# Patient Record
Sex: Male | Born: 1975
Health system: Southern US, Community
[De-identification: ages and names within clinical notes are randomized; demographics above are authoritative.]

## PROBLEM LIST (undated history)

## (undated) DIAGNOSIS — T7840XA Allergy, unspecified, initial encounter: Secondary | ICD-10-CM

## (undated) DIAGNOSIS — F419 Anxiety disorder, unspecified: Secondary | ICD-10-CM

## (undated) DIAGNOSIS — K219 Gastro-esophageal reflux disease without esophagitis: Secondary | ICD-10-CM

## (undated) DIAGNOSIS — I10 Essential (primary) hypertension: Secondary | ICD-10-CM

## (undated) DIAGNOSIS — L309 Dermatitis, unspecified: Secondary | ICD-10-CM

## (undated) HISTORY — DX: Allergy, unspecified, initial encounter: T78.40XA

## (undated) HISTORY — DX: Gastro-esophageal reflux disease without esophagitis: K21.9

## (undated) HISTORY — DX: Dermatitis, unspecified: L30.9

## (undated) HISTORY — DX: Anxiety disorder, unspecified: F41.9

## (undated) HISTORY — PX: WISDOM TOOTH EXTRACTION: SHX21

---
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
## 2003-12-05 ENCOUNTER — Emergency Department (HOSPITAL_COMMUNITY): Admission: AD | Admit: 2003-12-05 | Discharge: 2003-12-05 | Payer: Self-pay | Admitting: Family Medicine

## 2004-01-05 ENCOUNTER — Emergency Department (HOSPITAL_COMMUNITY): Admission: EM | Admit: 2004-01-05 | Discharge: 2004-01-05 | Payer: Self-pay | Admitting: Emergency Medicine

## 2009-05-12 ENCOUNTER — Emergency Department (HOSPITAL_COMMUNITY): Admission: EM | Admit: 2009-05-12 | Discharge: 2009-05-12 | Payer: Self-pay | Admitting: Emergency Medicine

## 2010-03-04 ENCOUNTER — Emergency Department (HOSPITAL_COMMUNITY): Admission: EM | Admit: 2010-03-04 | Discharge: 2010-03-04 | Payer: Self-pay | Admitting: Family Medicine

## 2011-03-13 LAB — CBC
MCHC: 34.4 g/dL (ref 30.0–36.0)
Platelets: 227 10*3/uL (ref 150–400)
RBC: 5.13 MIL/uL (ref 4.22–5.81)
WBC: 11.1 10*3/uL — ABNORMAL HIGH (ref 4.0–10.5)

## 2011-03-13 LAB — BASIC METABOLIC PANEL
CO2: 25 mEq/L (ref 19–32)
Calcium: 8.7 mg/dL (ref 8.4–10.5)
Creatinine, Ser: 0.97 mg/dL (ref 0.4–1.5)
GFR calc Af Amer: >60 mL/min (ref >60)

## 2011-03-13 LAB — DIFFERENTIAL
Basophils Relative: 0 % (ref 0–1)
Monocytes Relative: 8 % (ref 3–12)
Neutro Abs: 8 10*3/uL — ABNORMAL HIGH (ref 1.7–7.7)
Neutrophils Relative %: 72 % (ref 43–77)

## 2011-08-09 ENCOUNTER — Encounter: Payer: Self-pay | Admitting: Family Medicine

## 2011-08-09 ENCOUNTER — Ambulatory Visit (INDEPENDENT_AMBULATORY_CARE_PROVIDER_SITE_OTHER): Payer: 59 | Admitting: Family Medicine

## 2011-08-09 VITALS — BP 129/88 | HR 87 | Temp 98.8°F | Ht 71.0 in | Wt 226.8 lb

## 2011-08-09 DIAGNOSIS — L259 Unspecified contact dermatitis, unspecified cause: Secondary | ICD-10-CM

## 2011-08-09 DIAGNOSIS — L309 Dermatitis, unspecified: Secondary | ICD-10-CM | POA: Insufficient documentation

## 2011-08-09 DIAGNOSIS — F419 Anxiety disorder, unspecified: Secondary | ICD-10-CM | POA: Insufficient documentation

## 2011-08-09 DIAGNOSIS — K219 Gastro-esophageal reflux disease without esophagitis: Secondary | ICD-10-CM | POA: Insufficient documentation

## 2011-08-09 DIAGNOSIS — R030 Elevated blood-pressure reading, without diagnosis of hypertension: Secondary | ICD-10-CM

## 2011-08-09 DIAGNOSIS — I10 Essential (primary) hypertension: Secondary | ICD-10-CM | POA: Insufficient documentation

## 2011-08-09 DIAGNOSIS — F411 Generalized anxiety disorder: Secondary | ICD-10-CM

## 2011-08-09 NOTE — Patient Instructions (Signed)
It was nice meeting you and your daughter Let me know when you need new scripts. The only major health issue to address is your weight. See me in 6 months.

## 2011-08-09 NOTE — Progress Notes (Signed)
  Subjective:    Patient ID: Eric Bradford., male    DOB: 24-Apr-1976, 35 y.o.   MRN: [id number]  HPI Uses only 6-7 xanax per month average for situational anxiety. Gets annual flu shots through employee health.  Thinks he got tetanus about 2004 - maybe Tdap - he will check. Had cholesterol done ~ 1year ago with benefit package.  Was told great LDL, but HDL was a bit low.    Review of Systems     Objective:   Physical Exam  HEENT normal Neck supple without masses Lungs clear Cardiac RRR      Assessment & Plan:

## 2011-08-10 NOTE — Assessment & Plan Note (Signed)
Stable on meds  

## 2011-08-10 NOTE — Assessment & Plan Note (Signed)
Continue low salt diet and wt loss.  May be able to stop carvedolol at some point.

## 2011-11-05 ENCOUNTER — Observation Stay (HOSPITAL_COMMUNITY)
Admission: EM | Admit: 2011-11-05 | Discharge: 2011-11-05 | Disposition: A | Discharge status: Home / Self Care | Payer: 59 | Attending: Emergency Medicine | Admitting: Emergency Medicine

## 2011-11-05 ENCOUNTER — Encounter (HOSPITAL_COMMUNITY): Payer: Self-pay | Admitting: Nurse Practitioner

## 2011-11-05 ENCOUNTER — Emergency Department (HOSPITAL_COMMUNITY)
Admission: EM | Admit: 2011-11-05 | Discharge: 2011-11-05 | Disposition: A | Discharge status: Nursing Home (Includes ICF) | Payer: 59 | Source: Home / Self Care

## 2011-11-05 ENCOUNTER — Encounter (HOSPITAL_COMMUNITY): Payer: Self-pay | Admitting: *Deleted

## 2011-11-05 DIAGNOSIS — R112 Nausea with vomiting, unspecified: Principal | ICD-10-CM | POA: Insufficient documentation

## 2011-11-05 DIAGNOSIS — R197 Diarrhea, unspecified: Secondary | ICD-10-CM | POA: Insufficient documentation

## 2011-11-05 DIAGNOSIS — E860 Dehydration: Secondary | ICD-10-CM

## 2011-11-05 DIAGNOSIS — K529 Noninfective gastroenteritis and colitis, unspecified: Secondary | ICD-10-CM

## 2011-11-05 DIAGNOSIS — K5289 Other specified noninfective gastroenteritis and colitis: Secondary | ICD-10-CM

## 2011-11-05 HISTORY — DX: Essential (primary) hypertension: I10

## 2011-11-05 LAB — COMPREHENSIVE METABOLIC PANEL
ALT: 25 U/L (ref 0–53)
AST: 13 U/L (ref 0–37)
Alkaline Phosphatase: 81 U/L (ref 39–117)
CO2: 22 mEq/L (ref 19–32)
GFR calc Af Amer: >90 mL/min (ref >90)
GFR calc non Af Amer: >90 mL/min (ref >90)
Glucose, Bld: 118 mg/dL — ABNORMAL HIGH (ref 70–99)
Potassium: 3.7 mEq/L (ref 3.5–5.1)
Sodium: 140 mEq/L (ref 135–145)

## 2011-11-05 LAB — URINALYSIS, ROUTINE W REFLEX MICROSCOPIC
Leukocytes, UA: NEGATIVE
Nitrite: NEGATIVE
Specific Gravity, Urine: 1.024 (ref 1.005–1.030)
Urobilinogen, UA: 0.2 mg/dL (ref 0.0–1.0)

## 2011-11-05 LAB — CBC
Hemoglobin: 14.8 g/dL (ref 13.0–17.0)
RBC: 5.33 MIL/uL (ref 4.22–5.81)
WBC: 17.3 10*3/uL — ABNORMAL HIGH (ref 4.0–10.5)

## 2011-11-05 LAB — URINE MICROSCOPIC-ADD ON

## 2011-11-05 MED ORDER — LORAZEPAM 2 MG/ML IJ SOLN
1.0000 mg | Freq: Once | INTRAMUSCULAR | Status: AC
  Administered 2011-11-05: 1 mg via INTRAVENOUS
  Filled 2011-11-05: qty 1

## 2011-11-05 MED ORDER — ONDANSETRON HCL 4 MG/2ML IJ SOLN: 4.0000 mg | Freq: Four times a day (QID) | INTRAMUSCULAR | Status: DC | PRN

## 2011-11-05 MED ORDER — SODIUM CHLORIDE 0.9 % IV SOLN
INTRAVENOUS | Status: DC
  Administered 2011-11-05: 19:00:00 via INTRAVENOUS

## 2011-11-05 MED ORDER — ONDANSETRON HCL 4 MG/2ML IJ SOLN
4.0000 mg | Freq: Once | INTRAMUSCULAR | Status: AC
  Administered 2011-11-05: 4 mg via INTRAVENOUS
  Filled 2011-11-05: qty 2

## 2011-11-05 MED ORDER — ACETAMINOPHEN 325 MG PO TABS
650.0000 mg | ORAL_TABLET | Freq: Once | ORAL | Status: AC
  Administered 2011-11-05: 650 mg via ORAL
  Filled 2011-11-05: qty 2

## 2011-11-05 MED ORDER — ACETAMINOPHEN 325 MG PO TABS: 650.0000 mg | ORAL_TABLET | ORAL | Status: DC | PRN

## 2011-11-05 MED ORDER — SODIUM CHLORIDE 0.9 % IV BOLUS (SEPSIS)
1000.0000 mL | Freq: Once | INTRAVENOUS | Status: AC
  Administered 2011-11-05: 1000 mL via INTRAVENOUS

## 2011-11-05 MED ORDER — SODIUM CHLORIDE 0.9 % IV BOLUS (SEPSIS): 1000.0000 mL | Freq: Once | INTRAVENOUS | Status: DC

## 2011-11-05 MED ORDER — ONDANSETRON 4 MG PO TBDP
8.0000 mg | ORAL_TABLET | Freq: Once | ORAL | Status: AC
  Administered 2011-11-05: 8 mg via ORAL

## 2011-11-05 MED ORDER — CARVEDILOL 12.5 MG PO TABS
12.5000 mg | ORAL_TABLET | Freq: Two times a day (BID) | ORAL | Status: AC
  Administered 2011-11-05: 12.5 mg via ORAL
  Filled 2011-11-05: qty 1

## 2011-11-05 MED ORDER — ONDANSETRON 4 MG PO TBDP
ORAL_TABLET | ORAL | Status: AC
  Filled 2011-11-05: qty 2

## 2011-11-05 MED ORDER — ONDANSETRON HCL 4 MG PO TABS: 4.0000 mg | ORAL_TABLET | Freq: Four times a day (QID) | ORAL | Status: AC | PRN

## 2011-11-05 NOTE — ED Provider Notes (Signed)
I saw pt primarily and then tranferred to the CDU for further care by the PA.  Gwyneth Sprout, MD 11/05/11 2303

## 2011-11-05 NOTE — ED Provider Notes (Signed)
05:30 PM The patient is in the CDU and dehydration protocol. He has had nausea, vomiting, and diarrhea that began today with multiple family members having a similar illness. He has been febrile and tachycardic in the emergency department. He did not take his morning dose of Coreg today. The plan is to continue IV fluids, monitoring, and administer his Coreg with the hope that his heart rate will settle into a normal range and he can be discharged home as he is tolerating PO well.  06:00 PM  Date: 11/05/2011  Rate: 124  Rhythm: sinus tachycardia  QRS Axis: left  Intervals: normal  ST/T Wave abnormalities: normal  Conduction Disutrbances:none  Narrative Interpretation:   Old EKG Reviewed: none available   8:28 PM Patient reports he feels ready for discharge home. He has not had any continued vomiting in the CDU. His heart rate on a telemetry monitor in his room is hovering around 105 beats per minute. I will discharge him home with instructions to continue oral rehydration; he will be given a prescription for Zofran. I have advised him to return to the emergency department for reevaluation if he is unable to keep down fluids or if he feels that his heart rate has started to rise again.   Elwyn Reach [place], [place] 11/05/11 2030

## 2011-11-05 NOTE — ED Notes (Signed)
Pt reminded of need for urine sample, unable to void at this time

## 2011-11-05 NOTE — ED Notes (Signed)
Pt repeatedly requesting PO fluids.  Oral rehydration solution provided - instructed to take small, frequent sips.

## 2011-11-05 NOTE — ED Notes (Signed)
C/O n/v/d since middle of the night.  C/O abd soreness from vomiting, but otherwise no pain.  Daughter has similar sxs.  Has had approx 5-6 times; watery stools without blood.  C/O dizziness with standing.

## 2011-11-05 NOTE — ED Notes (Signed)
Pt. Ate 20 % of his dinner.

## 2011-11-05 NOTE — ED Provider Notes (Signed)
History     CSN: [id number] Arrival date & time: 11/05/2011 10:27 AM   None     Chief Complaint  Patient presents with  . GI Problem    Pt states he has diarrhea, vomiting and diarrhea since midnight    (Consider location/radiation/quality/duration/timing/severity/associated sxs/prior treatment) HPI Comments: Onset last night of watery diarrhea, nausea and vomiting. Has been able to retain only small amounts of water. No abd pain or fever. He states his wife and daughter are ill with same symptoms.   Patient is a 35 y.o. male presenting with GI illlness. The history is provided by the patient.  GI Problem  This is a new problem. The current episode started 12 to 24 hours ago. The problem occurs 5 to 10 times per day. The problem has not changed since onset.The stool consistency is described as watery. There has been no fever. Associated symptoms include vomiting. Pertinent negatives include no abdominal pain, no chills, no URI and no cough. He has tried nothing for the symptoms.    Past Medical History  Diagnosis Date  . Anxiety   . Allergy   . Eczema   . GERD (gastroesophageal reflux disease)   . Hypertension     Past Surgical History  Procedure Date  . Eye muscle surgery 12/04/1976    Family History  Problem Relation Age of Onset  . Cancer Mother   . Hypertension Father   . Alcohol abuse Maternal Uncle   . Stroke Paternal Grandfather   . Diabetes Paternal Grandfather     History  Substance Use Topics  . Smoking status: Never Smoker   . Smokeless tobacco: Never Used  . Alcohol Use: No      Review of Systems  Constitutional: Negative for chills.  Respiratory: Negative for cough and shortness of breath.   Gastrointestinal: Positive for nausea, vomiting and diarrhea. Negative for abdominal pain.  Genitourinary: Positive for decreased urine volume.    Allergies  Review of patient's allergies indicates no known allergies.  Home Medications   Current  Outpatient Rx  Name Route Sig Dispense Refill  . ALPRAZOLAM 1 MG PO TABS Oral Take 1 mg by mouth at bedtime as needed.      Marland Kitchen CARVEDILOL 12.5 MG PO TABS Oral Take 12.5 mg by mouth 2 (two) times daily with a meal.      . CLOBETASOL PROPIONATE 0.05 % EX CREA Topical Apply topically daily as needed. For hands     . FLUTICASONE PROPIONATE 50 MCG/ACT NA SUSP Nasal Place 2 sprays into the nose daily.      . IBUPROFEN 200 MG PO TABS Oral Take 200 mg by mouth every 6 (six) hours as needed.      Marland Kitchen RANITIDINE HCL 150 MG PO TABS Oral Take 150 mg by mouth 2 (two) times daily.        BP 116/66  Pulse 150  Temp(Src) 99.1 F (37.3 C) (Oral)  Resp 23  SpO2 99%  Physical Exam  Nursing note and vitals reviewed. Constitutional: He is oriented to person, place, and time. He appears well-developed and well-nourished.       NAD. Ill appearing.  HENT:  Head: Normocephalic and atraumatic.  Right Ear: Tympanic membrane, external ear and ear canal normal.  Left Ear: Tympanic membrane, external ear and ear canal normal.  Nose: Nose normal.  Mouth/Throat: Uvula is midline. Mucous membranes are dry. No oropharyngeal exudate, posterior oropharyngeal edema or posterior oropharyngeal erythema.  Neck: Neck supple.  Cardiovascular: Regular  rhythm and normal heart sounds.  Tachycardia present.   Pulmonary/Chest: Effort normal and breath sounds normal. No respiratory distress.  Abdominal: Soft. Bowel sounds are normal. He exhibits no distension and no mass. There is no tenderness. There is no guarding.  Lymphadenopathy:    He has no cervical adenopathy.  Neurological: He is alert and oriented to person, place, and time.  Skin: Skin is warm and dry.  Psychiatric: He has a normal mood and affect.    ED Course  Procedures (including critical care time)  Labs Reviewed - No data to display No results found.   1. Mild dehydration   2. Gastroenteritis       MDM  Pt transferred to [facility] via shuttle for IV  rehydration.         Melody Comas, [place] 11/05/11 1057

## 2011-11-05 NOTE — ED Notes (Signed)
Pt here from [facility] for dehydration, n/v/d since last night. Pt was given zofran 8 mg ODT at [facility]

## 2011-11-05 NOTE — ED Provider Notes (Addendum)
History     CSN: [id number] Arrival date & time: 11/05/2011 11:32 AM   First MD Initiated Contact with Patient 11/05/11 1136      Chief Complaint  Patient presents with  . Nausea    (Consider location/radiation/quality/duration/timing/severity/associated sxs/prior treatment) HPI Comments: Patient was seen at urgent care and given an ODT Zofran with persistent nausea and was sent here for hydration.  Patient is a 35 y.o. male presenting with vomiting and diarrhea. The history is provided by the patient.  Emesis  This is a new problem. The current episode started 3 to 5 hours ago. The problem occurs 5 to 10 times per day. The problem has not changed since onset.The emesis has an appearance of stomach contents. There has been no fever. Associated symptoms include chills and diarrhea. Pertinent negatives include no abdominal pain, no cough, no fever, no myalgias and no URI. Risk factors include ill contacts.  Diarrhea The primary symptoms include vomiting and diarrhea. Primary symptoms do not include fever, abdominal pain, dysuria or myalgias. The illness began today. The onset was sudden. The problem has not changed since onset. The illness is also significant for chills. The illness does not include back pain. Associated medical issues comments: None.    Past Medical History  Diagnosis Date  . Anxiety   . Allergy   . Eczema   . GERD (gastroesophageal reflux disease)   . Hypertension     Past Surgical History  Procedure Date  . Eye muscle surgery 12/04/1976    Family History  Problem Relation Age of Onset  . Cancer Mother   . Hypertension Father   . Alcohol abuse Maternal Uncle   . Stroke Paternal Grandfather   . Diabetes Paternal Grandfather     History  Substance Use Topics  . Smoking status: Never Smoker   . Smokeless tobacco: Never Used  . Alcohol Use: No      Review of Systems  Constitutional: Positive for chills. Negative for fever.  Respiratory: Negative  for cough.   Gastrointestinal: Positive for vomiting and diarrhea. Negative for abdominal pain and blood in stool.  Genitourinary: Negative for dysuria.  Musculoskeletal: Negative for myalgias and back pain.  All other systems reviewed and are negative.    Allergies  Review of patient's allergies indicates no known allergies.  Home Medications   Current Outpatient Rx  Name Route Sig Dispense Refill  . ALPRAZOLAM 1 MG PO TABS Oral Take 1 mg by mouth daily as needed. For anxiety.    Marland Kitchen CARVEDILOL 12.5 MG PO TABS Oral Take 12.5 mg by mouth 2 (two) times daily with a meal.      . CLOBETASOL PROPIONATE 0.05 % EX CREA Topical Apply topically daily as needed. For hands     . FLUTICASONE PROPIONATE 50 MCG/ACT NA SUSP Nasal Place 2 sprays into the nose daily.      . IBUPROFEN 200 MG PO TABS Oral Take 600 mg by mouth every 6 (six) hours as needed. For pain.    Marland Kitchen RANITIDINE HCL 150 MG PO TABS Oral Take 150 mg by mouth 2 (two) times daily.        BP 133/85  Pulse 129  Temp(Src) 98.6 F (37 C) (Oral)  Resp 16  SpO2 100%  Physical Exam  Nursing note and vitals reviewed. Constitutional: He is oriented to person, place, and time. He appears well-developed and well-nourished. No distress.  HENT:  Head: Normocephalic and atraumatic.  Mouth/Throat: Oropharynx is clear and moist. Mucous membranes  are dry.  Eyes: Conjunctivae and EOM are normal. Pupils are equal, round, and reactive to light.  Neck: Normal range of motion. Neck supple.  Cardiovascular: Regular rhythm and intact distal pulses.  Tachycardia present.   No murmur heard. Pulmonary/Chest: Effort normal and breath sounds normal. No respiratory distress. He has no wheezes. He has no rales.  Abdominal: Soft. He exhibits no distension. There is no tenderness. There is no rebound and no guarding.  Musculoskeletal: Normal range of motion. He exhibits no edema and no tenderness.  Neurological: He is alert and oriented to person, place, and  time.  Skin: Skin is warm and dry. No rash noted. No erythema.  Psychiatric: He has a normal mood and affect. His behavior is normal.    ED Course  Procedures (including critical care time)  Labs Reviewed  URINALYSIS, ROUTINE W REFLEX MICROSCOPIC - Abnormal; Notable for the following:    Hgb urine dipstick SMALL (*)    Ketones, ur 15 (*)    All other components within normal limits  URINE MICROSCOPIC-ADD ON   No results found.   No diagnosis found.    MDM   Pt with symptoms most consistent with a viral process with vomitting/diarrhea.  Denies bad food exposure and recent travel out of the country.  No recent abx.  No hx concerning for GU pathology or kidney stones.  Pt is awake and alert on exam without peritoneal signs.  Patient is tachycardic here.  Sent from urgent care due to persistent nausea after ODT Zofran.  Will concern for any surgical GI pathology.   Will give IV fluids and IV Zofran and recheck.   will by mouth challenge patient when he feels he can drink  3:12 PM After 2 L of IV fluid and Zofran, patient is feeling better and tolerating by mouth's. However persistent tachycardia and found to have a fever of 102.5. Patient given Tylenol. Also he is supposed to be taking carvedilol twice a day and did not have his morning dose which also may reflect his tachycardia. Home meds ordered. And will recheck. Patient states that he thought his urine looks dark however a UA is negative for infection.   4:52 PM  Patient with persistent tachycardia and fever. Still waiting for coreg. Will move over to CDU.    Gwyneth Sprout, MD 11/05/11 1652  Patient placed in the dehydration protocol.  Gwyneth Sprout, MD 11/05/11 [phone]

## 2011-11-05 NOTE — ED Notes (Signed)
Pt NAD, resp e/u, AOx4, ambulatory with steady gait at time of discharge. Pt states understanding of discharge instructions and denies questions at time of discharge.

## 2011-11-05 NOTE — ED Notes (Signed)
Pt brought straight back to room 2 when he arrived, there were 32 pts on the board and orthostatics done, MD notified, Pt states, "I need something done, I am a [employer] employee and I need zofran."  My daughter is in the waiting room also and she needs to come back.  I explained to him that there are 32 pts in the waiting room and there will be a wait.

## 2011-11-05 NOTE — ED Notes (Signed)
Transferred to ED via wheelchair w/ T. Berton Lan, EMT.  Wife updated on plan of care - verbalized understanding.

## 2011-11-10 NOTE — ED Provider Notes (Signed)
Medical screening examination/treatment/procedure(s) were performed by non-physician practitioner and as supervising physician I was immediately available for consultation/collaboration.  Luiz Blare MD   Luiz Blare, MD 11/10/11 [phone]

## 2012-04-26 ENCOUNTER — Other Ambulatory Visit: Payer: Self-pay | Admitting: Family Medicine

## 2012-04-26 DIAGNOSIS — R030 Elevated blood-pressure reading, without diagnosis of hypertension: Secondary | ICD-10-CM

## 2012-04-26 MED ORDER — CARVEDILOL 12.5 MG PO TABS: 12.5000 mg | ORAL_TABLET | Freq: Two times a day (BID) | ORAL | Status: DC

## 2012-04-26 NOTE — Assessment & Plan Note (Signed)
Refilled based on fax request 

## 2012-07-31 ENCOUNTER — Ambulatory Visit: Payer: 59 | Admitting: Family Medicine

## 2012-08-21 ENCOUNTER — Ambulatory Visit (INDEPENDENT_AMBULATORY_CARE_PROVIDER_SITE_OTHER): Payer: 59 | Admitting: Family Medicine

## 2012-08-21 ENCOUNTER — Encounter: Payer: Self-pay | Admitting: Family Medicine

## 2012-08-21 VITALS — BP 128/76 | HR 76 | Temp 98.7°F | Ht 71.0 in | Wt 229.0 lb

## 2012-08-21 DIAGNOSIS — K219 Gastro-esophageal reflux disease without esophagitis: Secondary | ICD-10-CM

## 2012-08-21 DIAGNOSIS — R030 Elevated blood-pressure reading, without diagnosis of hypertension: Secondary | ICD-10-CM

## 2012-08-21 DIAGNOSIS — F411 Generalized anxiety disorder: Secondary | ICD-10-CM

## 2012-08-21 DIAGNOSIS — F419 Anxiety disorder, unspecified: Secondary | ICD-10-CM

## 2012-08-21 DIAGNOSIS — L309 Dermatitis, unspecified: Secondary | ICD-10-CM

## 2012-08-21 DIAGNOSIS — L259 Unspecified contact dermatitis, unspecified cause: Secondary | ICD-10-CM

## 2012-08-21 MED ORDER — CLOBETASOL PROPIONATE 0.05 % EX CREA: TOPICAL_CREAM | Freq: Every day | CUTANEOUS | Status: DC | PRN

## 2012-08-21 MED ORDER — ALPRAZOLAM 1 MG PO TABS: 1.0000 mg | ORAL_TABLET | Freq: Every day | ORAL | Status: DC | PRN

## 2012-08-21 MED ORDER — FLUTICASONE PROPIONATE 50 MCG/ACT NA SUSP: 2.0000 | Freq: Every day | NASAL | Status: DC

## 2012-08-21 MED ORDER — CARVEDILOL 12.5 MG PO TABS: 12.5000 mg | ORAL_TABLET | Freq: Two times a day (BID) | ORAL | Status: DC

## 2012-08-21 MED ORDER — RANITIDINE HCL 150 MG PO TABS: 150.0000 mg | ORAL_TABLET | Freq: Two times a day (BID) | ORAL | Status: DC

## 2012-08-21 NOTE — Patient Instructions (Addendum)
Sign up for My Chart I refilled all your meds. Let me know when you get your flu shot. I refilled all your meds

## 2012-08-22 NOTE — Assessment & Plan Note (Signed)
Stable, refill meds

## 2012-08-22 NOTE — Assessment & Plan Note (Signed)
No change 

## 2012-08-22 NOTE — Progress Notes (Signed)
  Subjective:    Patient ID: Eric Dung., male    DOB: 17-Aug-1976, 36 y.o.   MRN: [id number]  HPI Doing well, no complaints.  Taking meds without problems.  Will get flu shot through employee health.   Review of Systems     Objective:   Physical ExamLungs clear Cardiac RRR without m or g        Assessment & Plan:

## 2013-01-18 ENCOUNTER — Other Ambulatory Visit: Payer: Self-pay

## 2013-05-29 ENCOUNTER — Other Ambulatory Visit: Payer: Self-pay | Admitting: Family Medicine

## 2013-05-29 ENCOUNTER — Other Ambulatory Visit: Payer: Self-pay | Admitting: *Deleted

## 2013-05-29 DIAGNOSIS — F419 Anxiety disorder, unspecified: Secondary | ICD-10-CM

## 2013-05-29 MED ORDER — ALPRAZOLAM 1 MG PO TABS: 1.0000 mg | ORAL_TABLET | Freq: Every day | ORAL | Status: DC | PRN

## 2013-05-29 NOTE — Telephone Encounter (Signed)
Refill called in. 

## 2013-05-30 ENCOUNTER — Ambulatory Visit (HOSPITAL_COMMUNITY)
Admission: RE | Admit: 2013-05-30 | Discharge: 2013-05-30 | Disposition: A | Discharge status: Home / Self Care | Payer: 59 | Source: Ambulatory Visit | Attending: Family Medicine | Admitting: Family Medicine

## 2013-05-30 ENCOUNTER — Ambulatory Visit (INDEPENDENT_AMBULATORY_CARE_PROVIDER_SITE_OTHER): Payer: 59 | Admitting: Family Medicine

## 2013-05-30 ENCOUNTER — Other Ambulatory Visit: Payer: Self-pay

## 2013-05-30 ENCOUNTER — Encounter: Payer: Self-pay | Admitting: Family Medicine

## 2013-05-30 VITALS — BP 135/94 | HR 97 | Temp 98.3°F | Resp 20 | Ht 71.0 in | Wt 239.0 lb

## 2013-05-30 DIAGNOSIS — K219 Gastro-esophageal reflux disease without esophagitis: Secondary | ICD-10-CM | POA: Insufficient documentation

## 2013-05-30 DIAGNOSIS — R079 Chest pain, unspecified: Secondary | ICD-10-CM

## 2013-05-30 DIAGNOSIS — R030 Elevated blood-pressure reading, without diagnosis of hypertension: Secondary | ICD-10-CM

## 2013-05-30 DIAGNOSIS — R059 Cough, unspecified: Secondary | ICD-10-CM | POA: Insufficient documentation

## 2013-05-30 DIAGNOSIS — R0789 Other chest pain: Secondary | ICD-10-CM | POA: Insufficient documentation

## 2013-05-30 DIAGNOSIS — R05 Cough: Secondary | ICD-10-CM | POA: Insufficient documentation

## 2013-05-30 MED ORDER — CARVEDILOL 12.5 MG PO TABS: 12.5000 mg | ORAL_TABLET | Freq: Two times a day (BID) | ORAL | Status: DC

## 2013-05-30 NOTE — Patient Instructions (Addendum)
It was great to see you again!  For your chest pain,  - I think this is stable angina. - I would like you to see a cardiologist for further evaluation, possibly an exercise stress test.  - Please continue taking medications as prescribed. Resume coreg as prescribed. - If you develop worsening chest pain, chest pain at rest, feel faint or sweaty during these episodes, you should seek immediate care either in clinic or the ED. - If you do not hear from our office on Monday or Tuesday, please call us to follow up on the cardiology appointment.  Follow-up with Dr. Leveda Anna after you see the cardiologist.

## 2013-05-30 NOTE — Progress Notes (Signed)
Pt reports chest pain that started while walking on treadmill for exercise - denies nausea, vomiting, diarrhea or radiating pain.  "I'm not going to the ER, I just want an EKG and to go home." Wyatt Haste, RN-BSN

## 2013-05-30 NOTE — Assessment & Plan Note (Addendum)
Pt is stable; likely stable angina (does not sound ischemic; possibly related to arrhythmia), though could also be anxiety or GERD-related. Unstable angina unlikely because no increasing sypmptoms or symptoms at rest. Unlikely PE because not pleuritic and no leg swelling, normal O2 sat. Unlikely MI because EKG WNL. Unlikely pneumothorax with normal breath sounds and no shortness of breath. Pain not severe or tearing and no known risk factors for dissection (not marfanoid). - Restart coreg at prescribed dose, hold for low blood pressures; reordered x 1 month to be sure pt has on hand - Cardiology referral ordered and would like pt to be seen next week for possible exercise stress test - Return precautions per AVS.  - Follow-up with Dr. Leveda Anna after you see the cardiologist.  Pt and plan discussed with preceptor Dr. Deirdre Priest.

## 2013-05-30 NOTE — Progress Notes (Signed)
Patient ID: Eric Dung., male   DOB: [birth year], 37 y.o.   MRN: [id number]  Subjective:    CC: Chest pain   HPI: Eric Narula. is a 37 y.o. male with anxiety, GERD, and prehypertension presenting with chest pain.   Chest pain - New issue. Pt states that 3 weeks ago he stopped taking his carvedilol due to not wanting to be on so many medications, by cutting down to 1/2 of prescribed dose x 1 week and then stopping. Around that time, he noticed chest crampy discomfort that was 4/10 at worst and came and went, coming on with physical activity and emotional stress and relieved with rest/calming down. It is somewhat related to position. He denies any shortness of breath, diaphoresis, dizziness/fainting, nausea, vomiting, fever, chills, cough, leg swelling, leg pain, muscle weakness or abdominal pain. He does not think this feels similar to GERD pain which is more epigastric-right arm and has mostly resolved with omeprazole. He also noticed that his heart would race, and was especially concerned when it raced even after stopping exercise and resting.  He has never had this type of chest pain and was on coreg for prehypertension and "adrenaline rushes"/anxiety. Yesterday morning he started back on 1/2 of his prescribed coreg. BP at home today 150s systolic and here is 135 systolic. He has also never seen a cardiologist.   Review of Systems - Per HPI.  PMH:  Anxiety GERD (Pre)HTN  SH:  Denies ever using tobacco. Very rarely alcohol use. X 1 year since last drink. No drugs.  FH:  - Grandfather with strokes in 78s - PreHTN runs in father's side of family including father.     Objective:  Physical Exam BP 135/94  Pulse 97  Temp(Src) 98.3 F (36.8 C) (Oral)  Resp 20  Ht 5\' 11"  (1.803 m)  Wt 239 lb (108.41 kg)  BMI 33.35 kg/m2  SpO2 97% GEN: NAD, pleasant CV: RRR, no m/r/g PULM: CTAB, normal effort, no wheezes or crackles ABD: Soft, obese, nontender, nondistended EXTR:  No tenderness, edema, erythema, or asymmetry CHEST: No tenderness to palpation NEURO: Alert, oriented, no focal deficit    Assessment:     Eric K Cameran Pettey. is a 37 y.o. male with anxiety, GERD, and prehypertension presenting with chest pain.     Plan:     # See problem list for problem-specific plans.

## 2013-06-09 NOTE — Progress Notes (Signed)
Patient ID: Eric Bradford., male   DOB: 07-Jul-1976, 37 y.o.   MRN: [id number]   Per RN message 6/30, called patient to discuss referral and at that time pt declined appt at this time stating "I'm doing better since starting back on my medication. I have been running up the stairs at [facility] with no problem." RN advised patient to give [place] a call back if he feels he would like for [place] to proceed with referral.  Leona Singleton, MD 06/09/2013 12:08 AM

## 2013-06-16 ENCOUNTER — Encounter: Payer: Self-pay | Admitting: Family Medicine

## 2013-06-16 ENCOUNTER — Ambulatory Visit (INDEPENDENT_AMBULATORY_CARE_PROVIDER_SITE_OTHER): Payer: 59 | Admitting: Family Medicine

## 2013-06-16 VITALS — BP 126/71 | HR 77 | Temp 98.9°F | Ht 71.0 in | Wt 242.0 lb

## 2013-06-16 DIAGNOSIS — J029 Acute pharyngitis, unspecified: Secondary | ICD-10-CM

## 2013-06-16 DIAGNOSIS — J039 Acute tonsillitis, unspecified: Secondary | ICD-10-CM

## 2013-06-16 MED ORDER — AMOXICILLIN 500 MG PO TABS: 500.0000 mg | ORAL_TABLET | Freq: Two times a day (BID) | ORAL | Status: DC

## 2013-06-16 NOTE — Assessment & Plan Note (Signed)
Given symptoms and physical exam, will treat with empiric amoxicillin x 7 days.

## 2013-06-16 NOTE — Patient Instructions (Addendum)
I have prescribed an antibiotic (Amoxicillin). Please take it twice a day for 7 days.    Tonsillitis Tonsils are lumps of lymphoid tissues at the back of the throat. Each tonsil has 20 crevices (crypts). Tonsils help fight nose and throat infections and keep infection from spreading to other parts of the body for the first 18 months of life. Tonsillitis is an infection of the throat that causes the tonsils to become red, tender, and swollen. CAUSES Sudden and, if treated, temporary (acute) tonsillitis is usually caused by infection with streptococcal bacteria. Long lasting (chronic) tonsillitis occurs when the crypts of the tonsils become filled with pieces of food and bacteria, which makes it easy for the tonsils to become constantly infected. SYMPTOMS  Symptoms of tonsillitis include:  A sore throat.  White patches on the tonsils.  Fever.  Tiredness. DIAGNOSIS Tonsillitis can be diagnosed through a physical exam. Diagnosis can be confirmed with the results of lab tests, including a throat culture. TREATMENT  The goals of tonsillitis treatment include the reduction of the severity and duration of symptoms, prevention of associated conditions, and prevention of disease transmission. Tonsillitis caused by bacteria can be treated with antibiotics. Usually, treatment with antibiotics is started before the cause of the tonsillitis is known. However, if it is determined that the cause is not bacterial, antibiotics will not treat the tonsillitis. If attacks of tonsillitis are severe and frequent, your caregiver may recommend surgery to remove the tonsils (tonsillectomy). HOME CARE INSTRUCTIONS   Rest as much as possible and get plenty of sleep.  Drink plenty of fluids. While the throat is very sore, eat soft foods or liquids, such as sherbet, soups, or instant breakfast drinks.  Eat frozen ice pops.  Older children and adults may gargle with a warm or cold liquid to help soothe the throat. Mix  1 teaspoon of salt in 1 cup of water.  Other family members who also develop a sore throat or fever should have a medical exam or throat culture.  Only take over-the-counter or prescription medicines for pain, discomfort, or fever as directed by your caregiver.  If you are given antibiotics, take them as directed. Finish them even if you start to feel better. SEEK MEDICAL CARE IF:   Your baby is older than 3 months with a rectal temperature of 100.5 F (38.1 C) or higher for more than 1 day.  Large, tender lumps develop in your neck.  A rash develops.  Green, yellow-brown, or bloody substance is coughed up.  You are unable to swallow liquids or food for 24 hours.  Your child is unable to swallow food or liquids for 12 hours. SEEK IMMEDIATE MEDICAL CARE IF:   You develop any new symptoms such as vomiting, severe headache, stiff neck, chest pain, or trouble breathing or swallowing.  You have severe throat pain along with drooling or voice changes.  You have severe pain, unrelieved with recommended medications.  You are unable to fully open the mouth.  You develop redness, swelling, or severe pain anywhere in the neck.  You have a fever.  Your baby is older than 3 months with a rectal temperature of 102 F (38.9 C) or higher.  Your baby is 60 months old or younger with a rectal temperature of 100.4 F (38 C) or higher. MAKE SURE YOU:   Understand these instructions.  Will watch your condition.  Will get help right away if you are not doing well or get worse. Document Released: 08/30/2005 Document  Revised: 02/12/2012 Document Reviewed: 01/26/2011 [facility] Patient Information 2014 [place], [place].

## 2013-06-16 NOTE — Progress Notes (Signed)
Subjective:     Patient ID: Eric Dung., male   DOB: Apr 14, 1976, 37 y.o.   MRN: [id number]  HPI 37 year old gentlemen presents to clinic for evaluation of sore throat.  1) Sore Throat - Patient reports sore throat x 5 days. - No associated fever, chills, cough, sputum production, or SOB.  - He does endorse hoarseness.  He also reports some "white spots" on the back of his throat. - OTC advil improves discomfort.  Review of Systems Per HPI    Objective:   Physical Exam Filed Vitals:   06/16/13 1535  BP: 126/71  Pulse: 77  Temp: 98.9 F (37.2 C)   General: well appearing. NAD. HEENT: Throat - pharyngeal/tonsillar erythema noted.  No tonsillar exudate noted. Neck: No cervical lymphadenopathy.     Assessment:     See Problem list    Plan:

## 2013-07-16 ENCOUNTER — Encounter: Payer: 59 | Admitting: Family Medicine

## 2013-08-19 ENCOUNTER — Telehealth: Payer: Self-pay | Admitting: Family Medicine

## 2013-08-19 DIAGNOSIS — R030 Elevated blood-pressure reading, without diagnosis of hypertension: Secondary | ICD-10-CM

## 2013-08-19 MED ORDER — CARVEDILOL 12.5 MG PO TABS: 12.5000 mg | ORAL_TABLET | Freq: Two times a day (BID) | ORAL | Status: DC

## 2013-08-19 NOTE — Telephone Encounter (Signed)
Patient calls needing an appt with Dr. Leveda Anna. Dr. Leveda Anna has no available appt until October. Patient will be out his carvedilol by this time and is asking for a refill on it. Patient has made an appt for 10/8 at 3:45

## 2013-08-19 NOTE — Telephone Encounter (Signed)
Please advise. Eric Bradford S  

## 2013-08-19 NOTE — Telephone Encounter (Signed)
Please notify patient that a refill was sent to [facility].

## 2013-08-20 NOTE — Telephone Encounter (Signed)
Related message,pt voiced understanding. Harith Mccadden S  

## 2013-09-10 ENCOUNTER — Ambulatory Visit: Payer: 59 | Admitting: Family Medicine

## 2013-09-17 ENCOUNTER — Encounter: Payer: Self-pay | Admitting: Family Medicine

## 2013-09-17 ENCOUNTER — Ambulatory Visit (INDEPENDENT_AMBULATORY_CARE_PROVIDER_SITE_OTHER): Payer: 59 | Admitting: Family Medicine

## 2013-09-17 VITALS — BP 134/87 | HR 85 | Temp 99.3°F | Ht 71.0 in | Wt 241.0 lb

## 2013-09-17 DIAGNOSIS — F411 Generalized anxiety disorder: Secondary | ICD-10-CM

## 2013-09-17 DIAGNOSIS — F419 Anxiety disorder, unspecified: Secondary | ICD-10-CM

## 2013-09-17 DIAGNOSIS — R030 Elevated blood-pressure reading, without diagnosis of hypertension: Secondary | ICD-10-CM

## 2013-09-17 DIAGNOSIS — L309 Dermatitis, unspecified: Secondary | ICD-10-CM

## 2013-09-17 DIAGNOSIS — K219 Gastro-esophageal reflux disease without esophagitis: Secondary | ICD-10-CM

## 2013-09-17 DIAGNOSIS — E785 Hyperlipidemia, unspecified: Secondary | ICD-10-CM

## 2013-09-17 DIAGNOSIS — E669 Obesity, unspecified: Secondary | ICD-10-CM

## 2013-09-17 DIAGNOSIS — Z0000 Encounter for general adult medical examination without abnormal findings: Secondary | ICD-10-CM

## 2013-09-17 DIAGNOSIS — L259 Unspecified contact dermatitis, unspecified cause: Secondary | ICD-10-CM

## 2013-09-17 LAB — COMPLETE METABOLIC PANEL WITH GFR
AST: 24 U/L (ref 0–37)
BUN: 14 mg/dL (ref 6–23)
Calcium: 9.4 mg/dL (ref 8.4–10.5)
Chloride: 104 mEq/L (ref 96–112)
Creat: 0.87 mg/dL (ref 0.50–1.35)
GFR, Est Non African American: >89 mL/min
Glucose, Bld: 88 mg/dL (ref 70–99)

## 2013-09-17 LAB — LIPID PANEL
Cholesterol: 131 mg/dL (ref 0–200)
HDL: 25 mg/dL — ABNORMAL LOW (ref >39)
Total CHOL/HDL Ratio: 5.2 Ratio
Triglycerides: 337 mg/dL — ABNORMAL HIGH (ref <150)

## 2013-09-17 LAB — CBC
MCV: 81.2 fL (ref 78.0–100.0)
Platelets: 218 10*3/uL (ref 150–400)
RBC: 5.27 MIL/uL (ref 4.22–5.81)
WBC: 10.1 10*3/uL (ref 4.0–10.5)

## 2013-09-17 MED ORDER — CLOBETASOL PROPIONATE 0.05 % EX CREA: TOPICAL_CREAM | Freq: Every day | CUTANEOUS | Status: DC | PRN

## 2013-09-17 MED ORDER — ALPRAZOLAM 1 MG PO TABS: 1.0000 mg | ORAL_TABLET | Freq: Every day | ORAL | Status: DC | PRN

## 2013-09-17 NOTE — Patient Instructions (Signed)
HDL is good cholesterol.  Given your history, you might want to google low HDL and see some dietary ways to increase. Keep working on your weight.  I am glad you will be getting more exercise. I will call if abnormal blood work.  Otherwise you can just view on my chart. Your last tetanus was 08/2006 and they are good for 10 years.

## 2013-09-18 ENCOUNTER — Encounter: Payer: Self-pay | Admitting: Family Medicine

## 2013-09-18 DIAGNOSIS — E669 Obesity, unspecified: Secondary | ICD-10-CM | POA: Insufficient documentation

## 2013-09-18 DIAGNOSIS — Z0000 Encounter for general adult medical examination without abnormal findings: Secondary | ICD-10-CM | POA: Insufficient documentation

## 2013-09-18 DIAGNOSIS — E7800 Pure hypercholesterolemia, unspecified: Secondary | ICD-10-CM | POA: Insufficient documentation

## 2013-09-18 NOTE — Assessment & Plan Note (Signed)
Controled, refill meds

## 2013-09-18 NOTE — Assessment & Plan Note (Signed)
Controled, refill meds 

## 2013-09-18 NOTE — Assessment & Plan Note (Signed)
Lifestyle intervention for now.

## 2013-09-18 NOTE — Assessment & Plan Note (Signed)
Wt loss  

## 2013-09-18 NOTE — Assessment & Plan Note (Signed)
Most problems seem wt related.  Strongly encouraged wt loss and increase exercise.  Also advised to read more about low HDL, high triglycerides and metabolic syndrom.

## 2013-09-18 NOTE — Progress Notes (Signed)
  Subjective:    Patient ID: Eric Bradford., male    DOB: 1976-01-20, 37 y.o.   MRN: [id number]  HPI  For annual exam.  He feels quite well and has no complaints.  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  Needs a lipid panel.  In the past, he has had low HDL.   He has not been exercising well although he is moving to a new apartment complex with an exercise room which he intends to use.  No at risk behavior    Review of Systems Denies CP, SOB, change in wt, appetite, bowels or bladder.  No bleeding and no skin worries.     Objective:   Physical Exam HEENT nl Neck supple Lungs clear Cardiac RRR without m or g Abd benign       Assessment & Plan:

## 2013-10-13 ENCOUNTER — Telehealth: Payer: Self-pay | Admitting: Family Medicine

## 2013-10-13 MED ORDER — FLUTICASONE PROPIONATE 50 MCG/ACT NA SUSP: 2.0000 | Freq: Every day | NASAL | Status: DC

## 2013-10-13 NOTE — Telephone Encounter (Signed)
Pt called because he needs a refill of his Flonase sent to the [facility] outpatient pharmacy.jw

## 2013-10-13 NOTE — Telephone Encounter (Signed)
Spoke with patietn and informed him that rx was sent over

## 2013-12-03 ENCOUNTER — Telehealth: Payer: Self-pay | Admitting: Family Medicine

## 2013-12-03 DIAGNOSIS — K219 Gastro-esophageal reflux disease without esophagitis: Secondary | ICD-10-CM

## 2013-12-03 MED ORDER — OMEPRAZOLE 40 MG PO CPDR: 40.0000 mg | DELAYED_RELEASE_CAPSULE | Freq: Every day | ORAL | Status: DC

## 2013-12-03 NOTE — Telephone Encounter (Signed)
Patient take OTC Prilosec but was told that if he received a RX for meds that he could get it a lot cheaper. Patient request RX. Please call patient once completed.

## 2014-02-17 ENCOUNTER — Telehealth: Payer: Self-pay | Admitting: Family Medicine

## 2014-02-17 NOTE — Telephone Encounter (Signed)
Please advise.thank you. Eric Bradford S  

## 2014-02-17 NOTE — Telephone Encounter (Signed)
Would like Allergra D called in to [facility] the [facility]

## 2014-02-19 MED ORDER — FEXOFENADINE-PSEUDOEPHED ER 60-120 MG PO TB12: 1.0000 | ORAL_TABLET | Freq: Two times a day (BID) | ORAL | Status: DC

## 2014-02-19 NOTE — Telephone Encounter (Signed)
Patient calls again. Msg never sent to Dr. Andria Frames. Please advise.

## 2014-02-19 NOTE — Telephone Encounter (Signed)
done

## 2014-02-20 ENCOUNTER — Telehealth: Payer: Self-pay | Admitting: Family Medicine

## 2014-02-20 NOTE — Telephone Encounter (Signed)
Pharmacy doesn't have allergra d. Could you change the RX to allegra? Pt doesn't have to have the D

## 2014-02-23 MED ORDER — FEXOFENADINE HCL 180 MG PO TABS: 180.0000 mg | ORAL_TABLET | Freq: Every day | ORAL | Status: DC

## 2014-02-23 NOTE — Telephone Encounter (Signed)
Done

## 2014-02-24 NOTE — Telephone Encounter (Signed)
Spoke with patient and informed him of below 

## 2014-03-18 ENCOUNTER — Telehealth: Payer: Self-pay | Admitting: Family Medicine

## 2014-03-18 MED ORDER — OSELTAMIVIR PHOSPHATE 75 MG PO CAPS: 75.0000 mg | ORAL_CAPSULE | Freq: Two times a day (BID) | ORAL | Status: DC

## 2014-03-18 NOTE — Telephone Encounter (Signed)
Pt called and would like medicine for the flu called into the [facility] in [place]. Please call wife at [phone] to let her know what to do. jw

## 2014-03-18 NOTE — Telephone Encounter (Signed)
Has classic symptoms of influenza.  Abrupt onset, fever, chills, myalgias, headache.  No cough, urinary symptoms or skin rash.  Warned to seek care if worsens.  Will rx ILI with tamiflu.

## 2014-03-19 ENCOUNTER — Telehealth: Payer: Self-pay | Admitting: Family Medicine

## 2014-03-19 NOTE — Telephone Encounter (Signed)
Pt needs drs note for illness Please advise

## 2014-03-19 NOTE — Telephone Encounter (Signed)
Will fwd to Dr.Hensel for review. Thanks. Mauricia Area

## 2014-03-19 NOTE — Telephone Encounter (Signed)
Called, He is feeling some better from ILI.  Plans to return to work Monday.  Sounds reasonable.  Will provide note.

## 2014-03-26 ENCOUNTER — Ambulatory Visit (INDEPENDENT_AMBULATORY_CARE_PROVIDER_SITE_OTHER): Payer: 59 | Admitting: Family Medicine

## 2014-03-26 ENCOUNTER — Encounter: Payer: Self-pay | Admitting: Family Medicine

## 2014-03-26 VITALS — BP 152/79 | HR 105 | Temp 99.7°F | Wt 242.0 lb

## 2014-03-26 DIAGNOSIS — R509 Fever, unspecified: Secondary | ICD-10-CM

## 2014-03-26 DIAGNOSIS — R059 Cough, unspecified: Secondary | ICD-10-CM

## 2014-03-26 DIAGNOSIS — I10 Essential (primary) hypertension: Secondary | ICD-10-CM

## 2014-03-26 DIAGNOSIS — R05 Cough: Secondary | ICD-10-CM

## 2014-03-26 DIAGNOSIS — M545 Low back pain, unspecified: Secondary | ICD-10-CM

## 2014-03-26 DIAGNOSIS — R109 Unspecified abdominal pain: Secondary | ICD-10-CM

## 2014-03-26 LAB — POCT URINALYSIS DIPSTICK
GLUCOSE UA: NEGATIVE
LEUKOCYTES UA: NEGATIVE
NITRITE UA: NEGATIVE
Protein, UA: >=300
Spec Grav, UA: >=1.03
UROBILINOGEN UA: 0.2
pH, UA: 6

## 2014-03-26 LAB — POCT UA - MICROSCOPIC ONLY

## 2014-03-26 MED ORDER — GUAIFENESIN-CODEINE 100-10 MG/5ML PO SYRP: 5.0000 mL | ORAL_SOLUTION | Freq: Three times a day (TID) | ORAL | Status: DC | PRN

## 2014-03-26 NOTE — Progress Notes (Signed)
Subjective:     Patient ID: Eric Bradford., male   DOB: 1976/05/26, 38 y.o.   MRN: [id number]  HPI Fever: fever and Chills for 2 wks,he has been usingTylenol and advil as needed with some improvement. He was also started on tamiflu by his PCP to treat prophylactically for influenza but this made him feel worse hence he d/c it. His fever improved but still sick. Cough: Coughing for 5 day,denies sputum, no SOB,no wheezing, no chest pain,no sick contact. He used OTC cough regimen with no improvement. Flank Pain: C/O flank pain on and off on both side, this started last week but feels better now with a pain of 1/10 in severity, he denies any trauma to his back,denies urinary symptoms.He also noticed his urine output has decreased over the last few days even though he feels he drinks adequate water. HTN: He is compliant with Coreg 12.5 mg BID,his home BP readings has been fine but at times at the doctors office his BP will go up.  Current Outpatient Prescriptions on File Prior to Visit  Medication Sig Dispense Refill  . carvedilol (COREG) 12.5 MG tablet Take 1 tablet (12.5 mg total) by mouth 2 (two) times daily with a meal.  180 tablet  3  . fexofenadine (ALLEGRA) 180 MG tablet Take 1 tablet (180 mg total) by mouth daily.  90 tablet  3  . fluticasone (FLONASE) 50 MCG/ACT nasal spray Place 2 sprays into both nostrils daily.  16 g  12  . ibuprofen (ADVIL,MOTRIN) 200 MG tablet Take 600 mg by mouth every 6 (six) hours as needed. For pain.      . magnesium oxide (MAG-OX) 400 MG tablet Take 400 mg by mouth daily. OTC mega mag      . omeprazole (PRILOSEC) 40 MG capsule Take 1 capsule (40 mg total) by mouth daily.  90 capsule  3  . ALPRAZolam (XANAX) 1 MG tablet Take 1 tablet (1 mg total) by mouth daily as needed. For anxiety.  30 tablet  5  . cholecalciferol (VITAMIN D) 1000 UNITS tablet Take 1,000 Units by mouth daily. OTC daily      . clobetasol cream (TEMOVATE) 0.05 % Apply topically daily as needed.  For hands  30 g  6  . oseltamivir (TAMIFLU) 75 MG capsule Take 1 capsule (75 mg total) by mouth 2 (two) times daily.  10 capsule  0   No current facility-administered medications on file prior to visit.   Past Medical History  Diagnosis Date  . Anxiety   . Allergy   . Eczema   . GERD (gastroesophageal reflux disease)   . Hypertension       Review of Systems  Constitutional: Positive for fever and chills.  Respiratory: Positive for cough. Negative for choking, shortness of breath and wheezing.   Cardiovascular: Negative.  Negative for leg swelling.  Gastrointestinal: Negative.   Genitourinary: Positive for flank pain and decreased urine volume. Negative for dysuria, urgency, hematuria and enuresis.  Musculoskeletal: Positive for myalgias.  Neurological: Negative.   All other systems reviewed and are negative.  Filed Vitals:   03/26/14 1617 03/26/14 1629  BP: 150/106 152/79  Pulse: 105   Temp: 99.7 F (37.6 C)   Weight: 242 lb (109.77 kg)   SpO2: 97%        Objective:   Physical Exam  Nursing note and vitals reviewed. Constitutional: He is oriented to person, place, and time. He appears well-developed. No distress.  HENT:  Head: Normocephalic.  Right Ear: Tympanic membrane and external ear normal. No tenderness.  Left Ear: Tympanic membrane and external ear normal. No tenderness.  Mouth/Throat: Uvula is midline, oropharynx is clear and moist and mucous membranes are normal.  Eyes: Conjunctivae are normal. Pupils are equal, round, and reactive to light. Right eye exhibits no discharge. Left eye exhibits no discharge.  Neck: Normal range of motion. Neck supple.  Cardiovascular: Normal rate, regular rhythm, normal heart sounds and intact distal pulses.   No murmur heard. Pulmonary/Chest: Effort normal and breath sounds normal. No respiratory distress. He has no wheezes. He exhibits no tenderness.  Abdominal: Soft. Bowel sounds are normal. He exhibits no distension and no  mass. There is no tenderness. There is no guarding and no CVA tenderness.  Musculoskeletal: Normal range of motion. He exhibits no edema.  Neurological: He is alert and oriented to person, place, and time.       Assessment:     Fever Cough Flank pain HTN     Plan:     Check problem list.

## 2014-03-26 NOTE — Patient Instructions (Signed)

## 2014-03-27 ENCOUNTER — Telehealth: Payer: Self-pay | Admitting: Family Medicine

## 2014-03-27 ENCOUNTER — Other Ambulatory Visit: Payer: 59

## 2014-03-27 DIAGNOSIS — R05 Cough: Secondary | ICD-10-CM | POA: Insufficient documentation

## 2014-03-27 DIAGNOSIS — R109 Unspecified abdominal pain: Secondary | ICD-10-CM | POA: Insufficient documentation

## 2014-03-27 DIAGNOSIS — R319 Hematuria, unspecified: Secondary | ICD-10-CM

## 2014-03-27 DIAGNOSIS — R509 Fever, unspecified: Secondary | ICD-10-CM | POA: Insufficient documentation

## 2014-03-27 DIAGNOSIS — R809 Proteinuria, unspecified: Secondary | ICD-10-CM

## 2014-03-27 DIAGNOSIS — R059 Cough, unspecified: Secondary | ICD-10-CM | POA: Insufficient documentation

## 2014-03-27 DIAGNOSIS — I10 Essential (primary) hypertension: Secondary | ICD-10-CM | POA: Insufficient documentation

## 2014-03-27 LAB — BASIC METABOLIC PANEL WITH GFR
BUN: 13 mg/dL (ref 6–23)
CHLORIDE: 102 meq/L (ref 96–112)
CO2: 26 meq/L (ref 19–32)
Calcium: 9 mg/dL (ref 8.4–10.5)
Creat: 0.85 mg/dL (ref 0.50–1.35)
GFR, Est African American: >89 mL/min
GLUCOSE: 90 mg/dL (ref 70–99)
POTASSIUM: 4.5 meq/L (ref 3.5–5.3)
Sodium: 140 mEq/L (ref 135–145)

## 2014-03-27 NOTE — Assessment & Plan Note (Signed)
Temp normal today. No actual temp measurement at home. Likely viral infection ( Influenza a possibility but unlikely due to season). No improvement with Tamiflu hence not taking it anymore. I recommended improve hydration, Tylenol prn pain. Rest at home. F/U soon if not improvement.

## 2014-03-27 NOTE — Assessment & Plan Note (Signed)
BP slightly elevated, especially diastolic. Repeat BP checked by me improved some. This might be due to the fact that he is sick today. Continue Coreg. Continue home BP monitoring. F/U with PCP in 1-2 wks for recheck.

## 2014-03-27 NOTE — Assessment & Plan Note (Signed)
Currently asymptomatic. UA checked today due to hx of reduce urine output. This could be related to poor oral intake and his current febrile illness. I will call him with is urine test.

## 2014-03-27 NOTE — Telephone Encounter (Signed)
I called and discussed his Urinalysis with him, moderate blood and protein >300. This might be transient due to current viral illness, but as discussed with him, I will like to r/o kidney disease by getting blood test. He will stop by the clinic today for BMP. I also suggested he recheck his UA with Dr Andria Frames in 1-2 weeks when he is fully recovered. He agreed to do so.

## 2014-03-27 NOTE — Assessment & Plan Note (Signed)
Likely URI. Cheratussin prn cough and congestion prescribed. F/U soon if no improvement.

## 2014-03-27 NOTE — Telephone Encounter (Signed)
Spoke with patient and he has been informed that the labs will not be in until next week

## 2014-03-27 NOTE — Telephone Encounter (Signed)
Pt called and would like his lab results called to him. jw

## 2014-03-27 NOTE — Telephone Encounter (Signed)
Pt is very worried about his lab results. He is afraid of missing the call from the lab. He wanted to know when he would be called

## 2014-03-27 NOTE — Progress Notes (Signed)
BMP AND URINE CULTURE DONE TODAY [facility] [place]

## 2014-03-29 LAB — URINE CULTURE
Colony Count: NO GROWTH
Organism ID, Bacteria: NO GROWTH

## 2014-03-30 NOTE — Telephone Encounter (Signed)
Pt was wondering why there might be high protein in his urine. Please advise

## 2014-03-30 NOTE — Telephone Encounter (Signed)
I seen where Dr. Gwendlyn Deutscher has returned a call.  Called and LM that labs including urine culture were fine.  He can call back if questions or still sick.

## 2014-04-01 ENCOUNTER — Telehealth: Payer: Self-pay | Admitting: Family Medicine

## 2014-04-01 NOTE — Telephone Encounter (Signed)
I called and discussed normal BMP and Urine culture with patient. I recommended scheduling follow up with his PCP to recheck his urine for blood and protein which was positive during last visit. He agreed with plan and verbalized understanding.

## 2014-07-29 ENCOUNTER — Ambulatory Visit (INDEPENDENT_AMBULATORY_CARE_PROVIDER_SITE_OTHER): Payer: 59 | Admitting: Family Medicine

## 2014-07-29 ENCOUNTER — Encounter: Payer: Self-pay | Admitting: Family Medicine

## 2014-07-29 VITALS — BP 160/97 | HR 84 | Temp 98.8°F | Wt 249.0 lb

## 2014-07-29 DIAGNOSIS — G4733 Obstructive sleep apnea (adult) (pediatric): Secondary | ICD-10-CM | POA: Insufficient documentation

## 2014-07-29 DIAGNOSIS — R0989 Other specified symptoms and signs involving the circulatory and respiratory systems: Secondary | ICD-10-CM

## 2014-07-29 DIAGNOSIS — R0609 Other forms of dyspnea: Secondary | ICD-10-CM

## 2014-07-29 DIAGNOSIS — I10 Essential (primary) hypertension: Secondary | ICD-10-CM

## 2014-07-29 DIAGNOSIS — E669 Obesity, unspecified: Secondary | ICD-10-CM

## 2014-07-29 DIAGNOSIS — R0683 Snoring: Secondary | ICD-10-CM

## 2014-07-29 MED ORDER — CARVEDILOL 25 MG PO TABS: 25.0000 mg | ORAL_TABLET | Freq: Two times a day (BID) | ORAL | Status: DC

## 2014-07-29 NOTE — Patient Instructions (Addendum)
I am increasing your carvedilol to 25 mg twice. I have ordered a sleep study - the nurse will set you up. Get more active to lose weight. Get your fasting blood work done any time after 10/15.  The orders are already.   Let me know when you get your flu shot.

## 2014-07-30 NOTE — Assessment & Plan Note (Signed)
Increase carvedilol and recheck soon.

## 2014-07-30 NOTE — Assessment & Plan Note (Signed)
At the root of many of his chronic problems.  We discussed diet, exercise and bariatric surgery.  He will focus on diet and portion control.

## 2014-07-30 NOTE — Assessment & Plan Note (Signed)
Sleep study for sleep apnea Work up

## 2014-07-30 NOTE — Progress Notes (Signed)
   Subjective:    Patient ID: Eric Bradford., male    DOB: 1976/10/31, 38 y.o.   MRN: [id number]  HPI Multiple issues 1. HBP.  High here and has been borderline high at home.  Knows wt is up. 2. Concern for sleep apnea.  Wife says loud snorer and occasionally seems to stop breathing for ~30 seconds.  Family history of sleep apnea 3. Obesity.  He does a lot of walking with his work.  He has trouble with portion control of meals.   4. Will need recheck lipids and fasting glucose for diabetes screen.      Review of Systems     Objective:   Physical Exam  Lungs clear Cardiac RRR without m or gallop Short, thick neck No peripheral edemal        Assessment & Plan:

## 2014-09-14 ENCOUNTER — Telehealth: Payer: Self-pay | Admitting: Family Medicine

## 2014-09-14 DIAGNOSIS — I10 Essential (primary) hypertension: Secondary | ICD-10-CM

## 2014-09-14 NOTE — Telephone Encounter (Signed)
Pt calling to let MD know the Coreg is not helping his heart rate or bp, still running 150/95 range. Wants to know what to do since he has a sleep study coming up.

## 2014-09-16 MED ORDER — DILTIAZEM HCL ER 180 MG PO CP24: 180.0000 mg | ORAL_CAPSULE | Freq: Every day | ORAL | Status: DC

## 2014-09-16 NOTE — Assessment & Plan Note (Signed)
See phone message.  Will also check TSH because of tachycardia

## 2014-09-21 ENCOUNTER — Telehealth: Payer: Self-pay | Admitting: Family Medicine

## 2014-09-21 MED ORDER — DILTIAZEM HCL ER BEADS 180 MG PO CP24: 180.0000 mg | ORAL_CAPSULE | Freq: Every day | ORAL | Status: DC

## 2014-09-21 NOTE — Telephone Encounter (Signed)
BPs still running high.  Will increase diltiazem.

## 2014-09-21 NOTE — Telephone Encounter (Signed)
Pt called and wanted Dr. Andria Frames to know that the increase in his BP medication has helped some but not enough and was wondering can he increase the dosage. Please call and let him know and if he doesn't answer please leave detailed message on his voicemail. Eric Bradford

## 2014-10-02 ENCOUNTER — Ambulatory Visit (HOSPITAL_BASED_OUTPATIENT_CLINIC_OR_DEPARTMENT_OTHER): Payer: 59 | Attending: Internal Medicine

## 2014-10-02 VITALS — Ht 71.0 in | Wt 248.0 lb

## 2014-10-02 DIAGNOSIS — G4730 Sleep apnea, unspecified: Secondary | ICD-10-CM | POA: Diagnosis present

## 2014-10-02 DIAGNOSIS — G4733 Obstructive sleep apnea (adult) (pediatric): Secondary | ICD-10-CM | POA: Diagnosis not present

## 2014-10-02 DIAGNOSIS — R0683 Snoring: Secondary | ICD-10-CM

## 2014-10-03 DIAGNOSIS — G4733 Obstructive sleep apnea (adult) (pediatric): Secondary | ICD-10-CM

## 2014-10-03 NOTE — Sleep Study (Signed)
   NAME: Eric Bradford. DATE OF BIRTH:  11/14/1976 MEDICAL RECORD NUMBER [id number]  LOCATION: [facility]  PHYSICIAN: Yunuen Mordan D  DATE OF STUDY: 10/02/2014  SLEEP STUDY TYPE: Nocturnal Polysomnogram               REFERRING PHYSICIAN: Zigmund Gottron,*  INDICATION FOR STUDY: Hypersomnia with sleep apnea  EPWORTH SLEEPINESS SCORE:   17/24 HEIGHT: 5\' 11"  (180.3 cm)  WEIGHT: 248 lb (112.492 kg)    Body mass index is 34.6 kg/(m^2).  NECK SIZE: 18 in.  MEDICATIONS: Charted for review  SLEEP ARCHITECTURE: Split-night protocol. During the diagnostic phase, total sleep time 128 minutes with sleep efficiency 93.8%. Stage I was 7%, stage II 84.8%, stage III absent, REM 8.2% of total sleep time. Sleep latency 1.5 minutes, REM latency 43 minutes, awake after sleep onset 7 minutes, arousal index 18.8, bedtime medication: None  RESPIRATORY DATA: Apnea hypopneas index (AHI) 102.2 per hour. 218 total events scored including 191 obstructive apneas, 1 mixed apnea, 26 hypopneas.. Non-positional events. REM AHI 108.6 per hour. CPAP titration 218 CWP, AHI 7.6 per hour. He wore a medium fullface mask.  OXYGEN DATA: Before CPAP snoring was very loud with oxygen desaturation to a nadir of 59% on room air. With CPAP control, snoring was prevented and mean oxygen saturation was 92.4%.  CARDIAC DATA: Sinus rhythm  MOVEMENT/PARASOMNIA: No significant movement disturbance, bathroom 2  IMPRESSION/ RECOMMENDATION:   1) Severe obstructive sleep apnea/hypopneas syndrome, AHI 102.2 per hour with non-positional events. REM AHI 108.6 per hour. Very loud snoring with oxygen desaturation twin A dear of 59% on room air. 2) Successful CPAP titration to 18 CWP, AHI 7.6 per hour. He wore a medium [employer] Simplus fullface mask with heated humidifier. Snoring was prevented and mean oxygen saturation was 92.4% on CPAP with room air.  Deneise Lever Diplomate, American Board  of Sleep Medicine  ELECTRONICALLY SIGNED ON:  10/03/2014, 2:31 PM [place] PH: [phone]   FX: [phone] [place]

## 2014-10-05 ENCOUNTER — Encounter: Payer: Self-pay | Admitting: Family Medicine

## 2014-10-14 ENCOUNTER — Other Ambulatory Visit: Payer: 59

## 2014-10-14 DIAGNOSIS — I10 Essential (primary) hypertension: Secondary | ICD-10-CM

## 2014-10-14 DIAGNOSIS — R0683 Snoring: Secondary | ICD-10-CM

## 2014-10-14 LAB — COMPLETE METABOLIC PANEL WITH GFR
ALT: 32 U/L (ref 0–53)
AST: 21 U/L (ref 0–37)
Albumin: 4.2 g/dL (ref 3.5–5.2)
Alkaline Phosphatase: 85 U/L (ref 39–117)
BILIRUBIN TOTAL: 0.4 mg/dL (ref 0.2–1.2)
BUN: 24 mg/dL — AB (ref 6–23)
CO2: 25 meq/L (ref 19–32)
CREATININE: 1.04 mg/dL (ref 0.50–1.35)
Calcium: 9.3 mg/dL (ref 8.4–10.5)
Chloride: 105 mEq/L (ref 96–112)
GFR, Est African American: >89 mL/min
GFR, Est Non African American: >89 mL/min
Glucose, Bld: 79 mg/dL (ref 70–99)
Potassium: 4.6 mEq/L (ref 3.5–5.3)
Sodium: 139 mEq/L (ref 135–145)
Total Protein: 6.5 g/dL (ref 6.0–8.3)

## 2014-10-14 LAB — LIPID PANEL
CHOLESTEROL: 137 mg/dL (ref 0–200)
HDL: 23 mg/dL — AB (ref >39)
LDL Cholesterol: 72 mg/dL (ref 0–99)
TRIGLYCERIDES: 210 mg/dL — AB (ref <150)
Total CHOL/HDL Ratio: 6 Ratio
VLDL: 42 mg/dL — AB (ref 0–40)

## 2014-10-14 NOTE — Progress Notes (Signed)
CMP,FLP AND TSH DONE TODAY Eric Bradford

## 2014-10-15 LAB — TSH: TSH: 1.673 u[IU]/mL (ref 0.350–4.500)

## 2014-10-16 ENCOUNTER — Encounter: Payer: Self-pay | Admitting: Family Medicine

## 2014-10-19 ENCOUNTER — Telehealth: Payer: Self-pay | Admitting: *Deleted

## 2014-10-19 NOTE — Telephone Encounter (Signed)
-----   Message from [facility]. to Zigmund Gottron, MD sent at 10/16/2014 10:55 PM -----     Hey Dr. Andria Frames.... I just wanted to give you an update on what's going on with me. I have recently, about three weeks ago, changed my eating habits. You probably noticed some stark differences on my labs verses the ones that I had last year, especially my glucose and triglycerides.  I am currently down about 18 pounds.  When I started, I was at 260 pounds. This is the reason that I have not called you back about refilling my blood pressure medicine. I know that it will need to be dropped pretty soon as my weight is coming off, therefore the blood pressure is going down. My heart rate has also drop back in the 70s & 80s for the most part and sometimes the 60s at night. I will probably call you within the next week or two and ask you to refill my prescriptions on the newest blood pressure medicine that you prescribed me. I'm taking the maximum dose of 360 mg of the new one (Cartia), that is 180 mg x 2 every day. The 90 day bottle will only last me 45 days.         Best regards,        Eric Bradford

## 2014-10-21 ENCOUNTER — Telehealth: Payer: Self-pay | Admitting: Family Medicine

## 2014-10-21 NOTE — Telephone Encounter (Signed)
Called and LM that creat was not elevated and that I am not concerned.

## 2014-10-21 NOTE — Telephone Encounter (Signed)
Pt received lab results on mychart, was concerned with his elevated creatinine levels, just wants to know if MD is concerned with them.

## 2014-10-22 ENCOUNTER — Telehealth: Payer: Self-pay | Admitting: Family Medicine

## 2014-10-22 ENCOUNTER — Other Ambulatory Visit: Payer: Self-pay | Admitting: Family Medicine

## 2014-10-22 MED ORDER — DILTIAZEM HCL ER COATED BEADS 360 MG PO TB24: 360.0000 mg | ORAL_TABLET | Freq: Every day | ORAL | Status: DC

## 2014-10-22 NOTE — Telephone Encounter (Signed)
Doing great.  Very serious about weight loss and has already lost 20 lbs (260 max to 240 currently) Given sleep study results.

## 2014-10-22 NOTE — Telephone Encounter (Signed)
Pt called and would like to know what his sleep study results are and if he needs a CPAP machine when will he get this. If he doesn't answer please leave a detailed message. jw

## 2014-11-02 ENCOUNTER — Telehealth: Payer: Self-pay | Admitting: Family Medicine

## 2014-11-02 DIAGNOSIS — G4733 Obstructive sleep apnea (adult) (pediatric): Secondary | ICD-10-CM

## 2014-11-02 NOTE — Telephone Encounter (Signed)
Pt called because the CPAP machine that he has is not letting him sleep because it is filling his stomach with to much air. He would like the prescription re-written to be changed to an automatic CPAP machine. jw

## 2014-11-02 NOTE — Telephone Encounter (Signed)
Done via my response to [facility].

## 2014-11-05 ENCOUNTER — Other Ambulatory Visit: Payer: Self-pay | Admitting: *Deleted

## 2014-11-05 DIAGNOSIS — F419 Anxiety disorder, unspecified: Secondary | ICD-10-CM

## 2014-11-05 MED ORDER — ALPRAZOLAM 1 MG PO TABS: 1.0000 mg | ORAL_TABLET | Freq: Every day | ORAL | Status: DC | PRN

## 2014-11-16 ENCOUNTER — Ambulatory Visit: Payer: 59 | Admitting: Family Medicine

## 2014-11-16 ENCOUNTER — Ambulatory Visit (INDEPENDENT_AMBULATORY_CARE_PROVIDER_SITE_OTHER): Payer: 59 | Admitting: Family Medicine

## 2014-11-16 ENCOUNTER — Encounter: Payer: Self-pay | Admitting: Family Medicine

## 2014-11-16 VITALS — BP 151/82 | HR 91 | Temp 98.0°F | Wt 238.0 lb

## 2014-11-16 DIAGNOSIS — B029 Zoster without complications: Secondary | ICD-10-CM | POA: Insufficient documentation

## 2014-11-16 MED ORDER — VALACYCLOVIR HCL 1 G PO TABS: 1000.0000 mg | ORAL_TABLET | Freq: Three times a day (TID) | ORAL | Status: DC

## 2014-11-16 NOTE — Patient Instructions (Signed)
I agree that you have shingles. The antivirals will help it feel better more quickly and not be contagious for as long. You are safe to return to work as long as you keep the rash covered and wash your hands any time to touch it.

## 2014-11-20 ENCOUNTER — Telehealth: Payer: Self-pay | Admitting: Family Medicine

## 2014-11-20 NOTE — Telephone Encounter (Signed)
Pt calls the after hours line, with complaints of low heart rate without symtoms. He reports his heart rate is usually around 90 or more. He is prescribed diltiazem and carvedilol for hypertension. Pt states he started halving the dilt about 3 weeks and then totally stopped it about 2 weeks ago. He did not seek medical recommendation to do this,nor call his physician to inform of the changes. He is still taking the coreg 25 mg BID.  He reports he has lost 20 lbs (intentionally) and has been wearing a CPAP nightly (new). His BP today was 130s/80 and his HR is mid-50's. He reports he has been feeling more fatigued and this is making him nervous and he is fearful to go to sleep. He has been drinking caffeine  soda, and his HR is back up to 60-70. Patient has a history of anxiety. - Patient asks if he can half his coreg. I encouraged him not to alter his medications until he can be evaluated.  - I reassured him that his current numbers may different then what he has seen in the past, but they are normal ranges.  - I encouraged him to call Monday for SDA or make an appointment to discuss this with a provider. He agreed.  - I encouraged him, if he became symptomatic (CP, SOB, dizziness, syncope, diaphoresis, nausea) he needs to come in to be evaluated through ED. - Pt voiced understanding and was very appreciative for the advice.   Howard Pouch DO  PGY3 [facility]

## 2014-11-22 NOTE — Assessment & Plan Note (Signed)
Pain followed by dermatomal rash of left flank, present for 1 week - valtrex 1g tid - ok to return to work when on meds and rash covered with good hand hygeine practiced

## 2014-11-22 NOTE — Progress Notes (Signed)
   Subjective:    Patient ID: Eric Haste., male    DOB: [birth year], 38 y.o.   MRN: [id number]  HPI Pt complains of painful itchy rash of left flank that started about 1 week ago. A few days prior he had burning pain in the area and then he noticed a red bumpy rash. He has been using hydrocortisone which helps some with the itching. No fever, rash elsewhere.   Review of Systems See HPI    Objective:   Physical Exam  Constitutional: He is oriented to person, place, and time. He appears well-developed and well-nourished. No distress.  HENT:  Head: Normocephalic and atraumatic.  Eyes: Conjunctivae are normal. Right eye exhibits no discharge. Left eye exhibits no discharge. No scleral icterus.  Cardiovascular: Normal rate.   Pulmonary/Chest: Effort normal.  Abdominal: He exhibits no distension.  Neurological: He is alert and oriented to person, place, and time.  Skin: Skin is warm and dry. Rash noted. Rash is vesicular. Rash is not maculopapular. He is not diaphoretic.     Psychiatric: He has a normal mood and affect. His behavior is normal.  Nursing note and vitals reviewed.         Assessment & Plan:

## 2014-12-03 ENCOUNTER — Other Ambulatory Visit: Payer: Self-pay | Admitting: Family Medicine

## 2014-12-09 ENCOUNTER — Encounter: Payer: Self-pay | Admitting: Family Medicine

## 2014-12-09 ENCOUNTER — Ambulatory Visit (INDEPENDENT_AMBULATORY_CARE_PROVIDER_SITE_OTHER): Payer: 59 | Admitting: Family Medicine

## 2014-12-09 VITALS — BP 145/86 | HR 74 | Temp 97.9°F | Ht 71.0 in | Wt 233.9 lb

## 2014-12-09 DIAGNOSIS — F419 Anxiety disorder, unspecified: Secondary | ICD-10-CM

## 2014-12-09 DIAGNOSIS — G4733 Obstructive sleep apnea (adult) (pediatric): Secondary | ICD-10-CM

## 2014-12-09 DIAGNOSIS — I10 Essential (primary) hypertension: Secondary | ICD-10-CM

## 2014-12-09 DIAGNOSIS — E669 Obesity, unspecified: Secondary | ICD-10-CM

## 2014-12-09 MED ORDER — HYDROCHLOROTHIAZIDE 12.5 MG PO TABS: 12.5000 mg | ORAL_TABLET | Freq: Every day | ORAL | Status: DC

## 2014-12-09 MED ORDER — CLONAZEPAM 1 MG PO TABS: 1.0000 mg | ORAL_TABLET | Freq: Two times a day (BID) | ORAL | Status: DC | PRN

## 2014-12-09 NOTE — Patient Instructions (Signed)
I am pleased with everything.  Great wt loss, great treatment of sleep apnea. OK to stay off diltiazem because of now low heart rate.  Because your BP is a little high, I added HCTZ, a good BP med that does not affect your heart rate. If things are going well, see me in 6 months.

## 2014-12-09 NOTE — Progress Notes (Signed)
   Subjective:    Patient ID: Eric Bradford., male    DOB: 1976/01/17, 39 y.o.   MRN: [id number]  HPI Follow up multiple issues Sleep apnea: dropped from pretreatment AHI>100/hour to post treatment AHI<10/hour.  Tolerating well.  Hypertension and tachycardia.  He is doing better - dropped his diltiazem on his own due to low HR=50.  HR now fine and less tired.  BP slightly high. Had a flu shot through employee health. Will enter in records. Biggest news: very serious about weight loss.  Maxed out at 260 shortly after last visit with me.  He has lost 25 lbs!       Review of Systems     Objective:   Physical ExamLungs clear Cardiac RRR without m or gallop.        Assessment & Plan:

## 2014-12-09 NOTE — Assessment & Plan Note (Addendum)
Nice weight loss Maxed at 260.  Goal wt is 180.  Big congrats.

## 2014-12-09 NOTE — Assessment & Plan Note (Signed)
Reviewed his computer data.  On CPAP averaging 5-9 AHI.  Sleeping better.  More energy.  Great weight loss.

## 2014-12-09 NOTE — Assessment & Plan Note (Signed)
suboptimal control on just coreg.  Add HCTZ.  Stay off diltiazem due to bradycardia.

## 2015-02-11 ENCOUNTER — Other Ambulatory Visit: Payer: Self-pay | Admitting: Family Medicine

## 2015-06-02 ENCOUNTER — Telehealth: Payer: Self-pay | Admitting: Family Medicine

## 2015-06-02 NOTE — Telephone Encounter (Signed)
Needs refill on CPap supplies A fax has been sent from [facility] and would like to have it signed and faxed back

## 2015-06-02 NOTE — Telephone Encounter (Signed)
done

## 2015-06-02 NOTE — Telephone Encounter (Signed)
Will forward to Md to make them aware. Jazmin Hartsell,CMA

## 2015-06-09 ENCOUNTER — Ambulatory Visit (INDEPENDENT_AMBULATORY_CARE_PROVIDER_SITE_OTHER): Payer: 59 | Admitting: Family Medicine

## 2015-06-09 ENCOUNTER — Encounter: Payer: Self-pay | Admitting: Family Medicine

## 2015-06-09 VITALS — BP 139/93 | HR 93 | Temp 98.3°F | Ht 71.0 in | Wt 258.0 lb

## 2015-06-09 DIAGNOSIS — E669 Obesity, unspecified: Secondary | ICD-10-CM

## 2015-06-09 DIAGNOSIS — I10 Essential (primary) hypertension: Secondary | ICD-10-CM | POA: Diagnosis not present

## 2015-06-09 DIAGNOSIS — F419 Anxiety disorder, unspecified: Secondary | ICD-10-CM

## 2015-06-09 MED ORDER — BUPROPION HCL ER (XL) 150 MG PO TB24: 150.0000 mg | ORAL_TABLET | Freq: Every day | ORAL | Status: DC

## 2015-06-09 MED ORDER — HYDROCHLOROTHIAZIDE 25 MG PO TABS: 25.0000 mg | ORAL_TABLET | Freq: Every day | ORAL | Status: DC

## 2015-06-09 NOTE — Patient Instructions (Signed)
Please give the wellbutrin about a month.  Call me and let me know how you are doing.  I am starting a low dose, so I have plenty of room to increase. I also sent a prescription for 25 mg HCTZ.  You can use up your current supply by taking two pills per day.   Check you blood pressures regularly.  When you call in one month about the irritability, also let me know how the blood pressures are running.  Either call or my chart message.

## 2015-06-10 NOTE — Progress Notes (Signed)
   Subjective:    Patient ID: Eric Bradford., male    DOB: August 27, 1976, 38 y.o.   MRN: [id number]  HPI Three issues: 1. BP has been running high.  Taking HCTZ and Coreg.  Asymptomatic.  Denies CP, SOB, leg swelling. 2. Anxiety/irritability.  No overt depression symptoms.  Was an intermitant problem and did well with prn clonazepam.  Now more persistent.  Did have similar problem in the past.  Was treated with zoloft and wellbutrin.  Zoloft caused sexual dysfunction.  Wellbutrin relieved symptoms and was well tolerated.   3.  Obesity.  Regained weight he had previously lost  He is committed to getting back to better diet and exercise.    Review of Systems     Objective:   Physical ExamLungs clear Cardiac RRR without m or g BP retaken and confirmed high.        Assessment & Plan:

## 2015-06-10 NOTE — Assessment & Plan Note (Signed)
Reemphasize diet and exercise.

## 2015-06-10 NOTE — Assessment & Plan Note (Signed)
Since he did well in the past, restart wellbutrin.

## 2015-06-10 NOTE — Assessment & Plan Note (Signed)
Increase HCTZ from 12.5 to 25

## 2015-06-18 ENCOUNTER — Telehealth: Payer: Self-pay | Admitting: Family Medicine

## 2015-06-18 NOTE — Telephone Encounter (Signed)
Pt informed and agreeable. Eric Bradford  

## 2015-06-18 NOTE — Telephone Encounter (Signed)
Since Dr. Andria Frames is out until next Monday, I would recommend he use over-the-counter stool softeners and possibly glycerin suppositories to treat his constipation over the weekend. Most likely Dr. Lemmie Evens will prescribe something different but I would rather wait for him to do the change of medication as he knows the patient well. Thank you. Dorcas Mcmurray

## 2015-06-18 NOTE — Telephone Encounter (Signed)
Will forward to MD to advise. Lonzy Mato,CMA  

## 2015-06-18 NOTE — Telephone Encounter (Signed)
Patient recently started an antidepressant, says he is keeping him constipated, wants to know if he can discontinue it and try something different

## 2015-06-21 NOTE — Telephone Encounter (Signed)
Called and LM.  Use OTC miralax, 17 gms daily.  Instructed to call back if questions.

## 2015-07-15 ENCOUNTER — Encounter: Payer: Self-pay | Admitting: Family Medicine

## 2015-07-16 MED ORDER — VENLAFAXINE HCL ER 150 MG PO CP24: 150.0000 mg | ORAL_CAPSULE | Freq: Every day | ORAL | Status: DC

## 2015-07-16 NOTE — Telephone Encounter (Signed)
Previously on SSRI and had sexual dysfunction.  Will try effexor.

## 2015-07-19 ENCOUNTER — Encounter: Payer: Self-pay | Admitting: Family Medicine

## 2015-07-20 MED ORDER — BUPROPION HCL ER (XL) 150 MG PO TB24: 150.0000 mg | ORAL_TABLET | Freq: Every day | ORAL | Status: DC

## 2015-07-20 MED ORDER — POLYETHYLENE GLYCOL 3350 17 GM/SCOOP PO POWD: 17.0000 g | Freq: Two times a day (BID) | ORAL | Status: DC | PRN

## 2015-08-23 ENCOUNTER — Telehealth: Payer: Self-pay | Admitting: Family Medicine

## 2015-08-23 NOTE — Telephone Encounter (Signed)
Pt calls, states Wellbutrin is working well. Pt does feel that he could use a higher dosage and would like to request this. Also, patient requesting a refill on Miralax. Would like Dr. Andria Frames to send script as a 90 day supply, he can get it for $4 if sent this way. Any other questions, pls call patient.

## 2015-08-24 ENCOUNTER — Other Ambulatory Visit: Payer: Self-pay | Admitting: Family Medicine

## 2015-08-24 DIAGNOSIS — I10 Essential (primary) hypertension: Secondary | ICD-10-CM

## 2015-08-24 MED ORDER — POLYETHYLENE GLYCOL 3350 17 GM/SCOOP PO POWD: 17.0000 g | Freq: Two times a day (BID) | ORAL | Status: DC | PRN

## 2015-08-24 MED ORDER — BUPROPION HCL ER (XL) 300 MG PO TB24: 300.0000 mg | ORAL_TABLET | Freq: Every day | ORAL | Status: DC

## 2015-08-24 NOTE — Telephone Encounter (Signed)
Needs refill on carvedilol [facility] pharmacy

## 2015-08-25 MED ORDER — CARVEDILOL 25 MG PO TABS: 25.0000 mg | ORAL_TABLET | Freq: Two times a day (BID) | ORAL | Status: DC

## 2015-10-26 ENCOUNTER — Other Ambulatory Visit: Payer: Self-pay | Admitting: Family Medicine

## 2016-01-14 DIAGNOSIS — G4733 Obstructive sleep apnea (adult) (pediatric): Secondary | ICD-10-CM | POA: Diagnosis not present

## 2016-01-24 ENCOUNTER — Encounter: Payer: Self-pay | Admitting: Family Medicine

## 2016-01-24 DIAGNOSIS — F419 Anxiety disorder, unspecified: Secondary | ICD-10-CM

## 2016-01-26 MED ORDER — CLONAZEPAM 1 MG PO TABS: 1.0000 mg | ORAL_TABLET | Freq: Two times a day (BID) | ORAL | Status: DC | PRN

## 2016-01-27 DIAGNOSIS — H3552 Pigmentary retinal dystrophy: Secondary | ICD-10-CM | POA: Diagnosis not present

## 2016-01-27 DIAGNOSIS — H53143 Visual discomfort, bilateral: Secondary | ICD-10-CM | POA: Diagnosis not present

## 2016-01-27 DIAGNOSIS — H5501 Congenital nystagmus: Secondary | ICD-10-CM | POA: Diagnosis not present

## 2016-02-24 ENCOUNTER — Ambulatory Visit: Payer: 59

## 2016-02-24 ENCOUNTER — Emergency Department
Admission: EM | Admit: 2016-02-24 | Discharge: 2016-02-24 | Disposition: A | Discharge status: Home / Self Care | Payer: 59 | Source: Home / Self Care | Attending: Family Medicine | Admitting: Family Medicine

## 2016-02-24 ENCOUNTER — Encounter: Payer: Self-pay | Admitting: *Deleted

## 2016-02-24 DIAGNOSIS — R69 Illness, unspecified: Principal | ICD-10-CM

## 2016-02-24 DIAGNOSIS — J111 Influenza due to unidentified influenza virus with other respiratory manifestations: Secondary | ICD-10-CM

## 2016-02-24 LAB — POCT RAPID STREP A (OFFICE): RAPID STREP A SCREEN: NEGATIVE

## 2016-02-24 MED ORDER — AZITHROMYCIN 250 MG PO TABS: ORAL_TABLET | ORAL | Status: DC

## 2016-02-24 MED ORDER — BENZONATATE 200 MG PO CAPS: 200.0000 mg | ORAL_CAPSULE | Freq: Every day | ORAL | Status: DC

## 2016-02-24 NOTE — ED Provider Notes (Signed)
CSN: [id number]     Arrival date & time 02/24/16  1421 History   First MD Initiated Contact with Patient 02/24/16 1456     Chief Complaint  Patient presents with  . Cough  . Sore Throat      HPI Comments: Complains of 3 day history flu-like illness including myalgias, fever/chills, fatigue, and cough.  Also has mild nasal congestion and sore throat.  Cough is non-productive and somewhat worse at night.  No pleuritic pain but he feels tight in his anterior chest.  He has occasional wheezing.    The history is provided by the patient.    Past Medical History  Diagnosis Date  . Anxiety   . Allergy   . Eczema   . GERD (gastroesophageal reflux disease)   . Hypertension    Past Surgical History  Procedure Laterality Date  . Eye muscle surgery  12/04/1976  . Wisdom tooth extraction     Family History  Problem Relation Age of Onset  . Cancer Mother   . Hypertension Father   . Alcohol abuse Maternal Uncle   . Stroke Paternal Grandfather   . Diabetes Paternal Grandfather    Social History  Substance Use Topics  . Smoking status: Never Smoker   . Smokeless tobacco: Never Used  . Alcohol Use: No    Review of Systems + sore throat + hoarse + cough No pleuritic pain, but feels tight in his anterior chest + wheezing + nasal congestion + post-nasal drainage No sinus pain/pressure No itchy/red eyes No earache No hemoptysis No SOB + fever, + chills No nausea No vomiting No abdominal pain No diarrhea No urinary symptoms No skin rash + fatigue + myalgias No headache Used OTC meds without relief  Allergies  Review of patient's allergies indicates no known allergies.  Home Medications   Prior to Admission medications   Medication Sig Start Date End Date Taking? Authorizing Provider  buPROPion (WELLBUTRIN XL) 300 MG 24 hr tablet Take 1 tablet (300 mg total) by mouth daily. 08/24/15  Yes Zenia Resides, MD  carvedilol (COREG) 25 MG tablet Take 1 tablet (25 mg total) by  mouth 2 (two) times daily with a meal. 08/25/15  Yes Zenia Resides, MD  hydrochlorothiazide (HYDRODIURIL) 25 MG tablet Take 1 tablet (25 mg total) by mouth daily. 06/09/15  Yes Zenia Resides, MD  omeprazole (PRILOSEC) 40 MG capsule TAKE 1 CAPSULE BY MOUTH DAILY. 12/07/14  Yes Zenia Resides, MD  azithromycin (ZITHROMAX Z-PAK) 250 MG tablet Take 2 tabs today; then begin one tab once daily for 4 more days. 02/24/16   Kandra Nicolas, MD  benzonatate (TESSALON) 200 MG capsule Take 1 capsule (200 mg total) by mouth at bedtime. Take as needed for cough 02/24/16   Kandra Nicolas, MD  cholecalciferol (VITAMIN D) 1000 UNITS tablet Take 1,000 Units by mouth daily. OTC daily    Historical Provider, MD  clobetasol cream (TEMOVATE) 0.05 % APPLY TOPICALLY DAILY AS NEEDED FOR HANDS 10/26/15   Zenia Resides, MD  clonazePAM (KLONOPIN) 1 MG tablet Take 1 tablet (1 mg total) by mouth 2 (two) times daily as needed for anxiety. 01/26/16   Zenia Resides, MD  fexofenadine (ALLEGRA) 180 MG tablet Take 1 tablet (180 mg total) by mouth daily. 02/23/14   Zenia Resides, MD  fluticasone (FLONASE) 50 MCG/ACT nasal spray PLACE 2 SPRAYS INTO BOTH NOSTRILS DAILY. 02/11/15   Zenia Resides, MD  ibuprofen (ADVIL,MOTRIN) 200 MG tablet  Take 600 mg by mouth every 6 (six) hours as needed. For pain.    Historical Provider, MD  magnesium oxide (MAG-OX) 400 MG tablet Take 400 mg by mouth daily. OTC mega mag    Historical Provider, MD  polyethylene glycol powder (GLYCOLAX/MIRALAX) powder Take 17 g by mouth 2 (two) times daily as needed. 08/24/15   Zenia Resides, MD   Meds Ordered and Administered this Visit  Medications - No data to display  BP 130/84 mmHg  Pulse 107  Temp(Src) 100.1 F (37.8 C) (Oral)  Resp 18  Ht 5\' 11"  (1.803 m)  Wt 253 lb (114.76 kg)  BMI 35.30 kg/m2  SpO2 99% No data found.   Physical Exam Nursing notes and Vital Signs reviewed. Appearance:  Patient appears stated age, and in no acute  distress.  Patient is obese (BMI 35.3) Eyes:  Pupils are equal, round, and reactive to light and accomodation.  Extraocular movement is intact.  Conjunctivae are not inflamed  Ears:  Canals normal.  Tympanic membranes normal.  Nose:  Mildly congested turbinates.  No sinus tenderness.   Pharynx:  Normal Neck:  Supple.  Tender enlarged posterior/lateral nodes are palpated bilaterally  Lungs:  Clear to auscultation.  Breath sounds are equal.  Moving air well. Heart:  Regular rate and rhythm without murmurs, rubs, or gallops.  Abdomen:  Nontender without masses or hepatosplenomegaly.  Bowel sounds are present.  No CVA or flank tenderness.  Extremities:  No edema.  Skin:  No rash present.   ED Course  Procedures none    Labs Reviewed  POCT RAPID STREP A (OFFICE) negative      MDM   1. Influenza-like illness    Patient has missed window of opportunity for Tamiflu. Begin empiric Z-pak.  Prescription written for Benzonatate [facility]) to take at bedtime for night-time cough.  Take plain guaifenesin (1200mg  extended release tabs such as Mucinex) twice daily, with plenty of water, for cough and congestion.  May add Pseudoephedrine (30mg , one or two every 4 to 6 hours) for sinus congestion.  Get adequate rest.   May use Afrin nasal spray (or generic oxymetazoline) twice daily for about 5 days and then discontinue.  Also recommend using saline nasal spray several times daily and saline nasal irrigation (AYR is a common brand).  Use Flonase nasal spray each morning after using Afrin nasal spray and saline nasal irrigation. Try warm salt water gargles for sore throat.  Stop all antihistamines for now, and other non-prescription cough/cold preparations. May take Ibuprofen 200mg , 4 tabs every 6 to 8 hours with food for chest/sternum discomfort, body aches, headache, etc.   Follow-up with family doctor if not improving about one week.     Kandra Nicolas, MD 03/02/16 1310

## 2016-02-24 NOTE — ED Notes (Signed)
Pt c/o sore throat, nonproductive cough, and fever of 100.3 x 3-4 days.

## 2016-02-24 NOTE — Discharge Instructions (Signed)
Take plain guaifenesin (1200mg  extended release tabs such as Mucinex) twice daily, with plenty of water, for cough and congestion.  May add Pseudoephedrine (30mg , one or two every 4 to 6 hours) for sinus congestion.  Get adequate rest.   May use Afrin nasal spray (or generic oxymetazoline) twice daily for about 5 days and then discontinue.  Also recommend using saline nasal spray several times daily and saline nasal irrigation (AYR is a common brand).  Use Flonase nasal spray each morning after using Afrin nasal spray and saline nasal irrigation. Try warm salt water gargles for sore throat.  Stop all antihistamines for now, and other non-prescription cough/cold preparations. May take Ibuprofen 200mg , 4 tabs every 6 to 8 hours with food for chest/sternum discomfort, body aches, headache, etc.   Follow-up with family doctor if not improving about one week.

## 2016-02-28 ENCOUNTER — Other Ambulatory Visit: Payer: Self-pay | Admitting: Family Medicine

## 2016-03-10 ENCOUNTER — Telehealth: Payer: Self-pay | Admitting: Family Medicine

## 2016-03-10 MED ORDER — BUPROPION HCL ER (XL) 300 MG PO TB24: 300.0000 mg | ORAL_TABLET | Freq: Every day | ORAL | Status: DC

## 2016-03-10 NOTE — Telephone Encounter (Signed)
Received call from patient regarding Wellbutrin prescription. Stated that he had a refill at the outpatient pharmacy, but they were already closed for the weekend. He is currently out of pills and requested for a few to be sent to [facility] to last him until Monday. I informed patient that we typically do not refill any medications via telephone, but this would be an exception as his pharmacy was already closed. I sent in a prescription for 3 [facility]butrin tablets to [facility].  Algis Greenhouse. Jerline Pain, [place] Resident PGY-2 03/10/2016 7:19 PM

## 2016-04-05 ENCOUNTER — Other Ambulatory Visit: Payer: Self-pay | Admitting: *Deleted

## 2016-04-05 ENCOUNTER — Encounter: Payer: Self-pay | Admitting: Family Medicine

## 2016-04-05 MED ORDER — FLUTICASONE PROPIONATE 50 MCG/ACT NA SUSP: NASAL | Status: DC

## 2016-04-13 DIAGNOSIS — G4733 Obstructive sleep apnea (adult) (pediatric): Secondary | ICD-10-CM | POA: Diagnosis not present

## 2016-04-14 ENCOUNTER — Ambulatory Visit (INDEPENDENT_AMBULATORY_CARE_PROVIDER_SITE_OTHER): Payer: 59 | Admitting: Physician Assistant

## 2016-04-14 VITALS — BP 130/72 | HR 97 | Temp 98.9°F | Resp 18 | Ht 71.0 in | Wt 253.4 lb

## 2016-04-14 DIAGNOSIS — G510 Bell's palsy: Secondary | ICD-10-CM | POA: Diagnosis not present

## 2016-04-14 MED ORDER — VALACYCLOVIR HCL 1 G PO TABS: 1000.0000 mg | ORAL_TABLET | Freq: Two times a day (BID) | ORAL | Status: DC

## 2016-04-14 MED ORDER — PREDNISONE 10 MG PO TABS: ORAL_TABLET | ORAL | Status: DC

## 2016-04-14 NOTE — Progress Notes (Signed)
04/19/2016 10:36 AM   DOB: 08-15-1976 / MRN: [id number]  SUBJECTIVE:  [name] here today for left sided facil weakness that started 2 days ago.  He spoke to a neurosurgeon through his work who advised this is likely Bell's Palsy.  He denies HA, extremity weakness, confusion, and changes in sensation.  He has a history of well controlled HTN.  He is not getting worse or better. He denies a history of diabetes.    He has No Known Allergies.   He  has a past medical history of Anxiety; Allergy; Eczema; GERD (gastroesophageal reflux disease); and Hypertension.    He  reports that he has never smoked. He has never used smokeless tobacco. He reports that he does not drink alcohol or use illicit drugs. He  reports that he currently engages in sexual activity and has had male partners. The patient  has past surgical history that includes Eye muscle surgery (12/04/1976) and Wisdom tooth extraction.  His family history includes Alcohol abuse in his maternal uncle; Cancer in his mother; Diabetes in his paternal grandfather; Hypertension in his father; Stroke in his paternal grandfather.  Review of Systems  Constitutional: Negative for fever.  Gastrointestinal: Negative for nausea.  Skin: Negative for rash.  Neurological: Positive for focal weakness. Negative for dizziness, tingling, tremors, sensory change, speech change, seizures, loss of consciousness and headaches.    Problem list and medications reviewed and updated by myself where necessary, and exist elsewhere in the encounter.   OBJECTIVE:  BP 130/72 mmHg  Pulse 97  Temp(Src) 98.9 F (37.2 C) (Oral)  Resp 18  Ht 5\' 11"  (1.803 m)  Wt 253 lb 6.4 oz (114.941 kg)  BMI 35.36 kg/m2  SpO2 96%  Physical Exam  Constitutional: He is oriented to person, place, and time. He appears well-developed. He does not appear ill.  Eyes: Conjunctivae and EOM are normal. Pupils are equal, round, and reactive to light.  Cardiovascular: Normal  rate.   Pulmonary/Chest: Effort normal.  Abdominal: He exhibits no distension.  Musculoskeletal: Normal range of motion.  Neurological: He is alert and oriented to person, place, and time. He has normal strength and normal reflexes. No cranial nerve deficit or sensory deficit. Coordination and gait normal. GCS eye subscore is 4. GCS verbal subscore is 5. GCS motor subscore is 6.  Left sided CNVII palsy with mild sparing of the in frontalis contraction.   Skin: Skin is warm and dry. He is not diaphoretic.  Psychiatric: He has a normal mood and affect.  Nursing note and vitals reviewed.   No results found for this or any previous visit (from the past 72 hour(s)).  No results found.  ASSESSMENT AND PLAN  Eric Bradford was seen today for possible bells palsy.  Diagnoses and all orders for this visit:  Bell's palsy: His exam is consistent with Bell's.  Will spare him a scan today but advised that if he develops weakness, HA, changes in vision, that he must call 911.  Will see him back in 7 days. Appreciate Dr. Perfecto Kingdom recs.   -     predniSONE (DELTASONE) 10 MG tablet; Take 60 mg daily for 5 days, then take 50 mg on day 6, then 40 mg on day 7, then 30 mg on day 8 then then 20 mg on day 9, then 10 mg on day 10. -     valACYclovir (VALTREX) 1000 MG tablet; Take 1 tablet (1,000 mg total) by mouth 2 (two) times daily.  The patient was advised to call or return to clinic if he does not see an improvement in symptoms or to seek the care of the closest emergency department if he worsens with the above plan.   Philis Fendt, MHS, PA-C [facility] 04/19/2016 10:36 AM

## 2016-04-14 NOTE — Patient Instructions (Signed)
     IF you received an x-ray today, you will receive an invoice from [facility]. Please contact [facility] at [phone] with questions or concerns regarding your invoice.   IF you received labwork today, you will receive an invoice from [facility]. Please contact [facility] at [phone] with questions or concerns regarding your invoice.   Our billing staff will not be able to assist you with questions regarding bills from these companies.  You will be contacted with the lab results as soon as they are available. The fastest way to get your results is to activate your My Chart account. Instructions are located on the last page of this paperwork. If you have not heard from us regarding the results in 2 weeks, please contact this office.      

## 2016-06-21 ENCOUNTER — Ambulatory Visit (INDEPENDENT_AMBULATORY_CARE_PROVIDER_SITE_OTHER): Payer: 59 | Admitting: Family Medicine

## 2016-06-21 VITALS — BP 131/82 | HR 63 | Temp 98.1°F | Wt 259.0 lb

## 2016-06-21 DIAGNOSIS — I10 Essential (primary) hypertension: Secondary | ICD-10-CM | POA: Diagnosis not present

## 2016-06-21 DIAGNOSIS — Z114 Encounter for screening for human immunodeficiency virus [HIV]: Secondary | ICD-10-CM

## 2016-06-21 DIAGNOSIS — R358 Other polyuria: Secondary | ICD-10-CM

## 2016-06-21 DIAGNOSIS — R3589 Other polyuria: Secondary | ICD-10-CM | POA: Insufficient documentation

## 2016-06-21 LAB — POCT GLYCOSYLATED HEMOGLOBIN (HGB A1C): HEMOGLOBIN A1C: 5.5

## 2016-06-21 LAB — POCT URINALYSIS DIPSTICK
Bilirubin, UA: NEGATIVE
Glucose, UA: NEGATIVE
KETONES UA: NEGATIVE
Leukocytes, UA: NEGATIVE
Nitrite, UA: NEGATIVE
PROTEIN UA: NEGATIVE
SPEC GRAV UA: 1.015
UROBILINOGEN UA: 0.2
pH, UA: 5.5

## 2016-06-21 LAB — POCT UA - MICROSCOPIC ONLY

## 2016-06-21 NOTE — Patient Instructions (Signed)
I will call with you blood test results. Your diabetes test is normal, not even pre diabetic. At your next visit, remind me to check another urine on you to see if still blood.  I doubt this is anything. It is still important for you to lose weight.

## 2016-06-22 ENCOUNTER — Encounter: Payer: Self-pay | Admitting: Family Medicine

## 2016-06-22 LAB — HIV ANTIBODY (ROUTINE TESTING W REFLEX): HIV 1&2 Ab, 4th Generation: NONREACTIVE

## 2016-06-22 NOTE — Assessment & Plan Note (Signed)
Well controled. 

## 2016-06-22 NOTE — Assessment & Plan Note (Signed)
Normal UA (except trace blood)  Normal A1C.  Will repeat UA in 6 months.  Recheck blood.

## 2016-06-22 NOTE — Assessment & Plan Note (Signed)
Screen x 1.  Neg

## 2016-06-22 NOTE — Progress Notes (Signed)
   Subjective:    Patient ID: Eric Bradford., male    DOB: Apr 09, 1976, 40 y.o.   MRN: [id number]  HPI Several issues:   1. Mainly wants checked for DM.  Weakly pos family hx.  Knows his obesity puts him at risk.  He had several issues of blurry vision and has had polyuria.   2. Recheck HBP  No complaints. 3. Obesity, weight is now as high as it has every been with a BMI=36 4. Polyuria.  No urgency, very mild dysuria.   5. Due for HIV screen, Low risk.    Review of Systems     Objective:   Physical Exam VS and wt noted. Abd benign, no suprapubic tenderness.        Assessment & Plan:

## 2016-06-22 NOTE — Assessment & Plan Note (Signed)
BMI=36 and co morbid HBP.  Even though no DM, focus on wt loss.

## 2016-06-29 ENCOUNTER — Other Ambulatory Visit: Payer: Self-pay | Admitting: Family Medicine

## 2016-06-29 DIAGNOSIS — I10 Essential (primary) hypertension: Secondary | ICD-10-CM

## 2016-07-17 DIAGNOSIS — H25033 Anterior subcapsular polar age-related cataract, bilateral: Secondary | ICD-10-CM | POA: Diagnosis not present

## 2016-07-17 DIAGNOSIS — E70319 Ocular albinism, unspecified: Secondary | ICD-10-CM | POA: Diagnosis not present

## 2016-08-09 ENCOUNTER — Other Ambulatory Visit: Payer: Self-pay | Admitting: Family Medicine

## 2016-08-09 DIAGNOSIS — F419 Anxiety disorder, unspecified: Secondary | ICD-10-CM

## 2016-08-09 DIAGNOSIS — G4733 Obstructive sleep apnea (adult) (pediatric): Secondary | ICD-10-CM | POA: Diagnosis not present

## 2016-08-14 ENCOUNTER — Other Ambulatory Visit: Payer: Self-pay | Admitting: Family Medicine

## 2016-08-14 DIAGNOSIS — I10 Essential (primary) hypertension: Secondary | ICD-10-CM

## 2016-10-11 ENCOUNTER — Encounter: Payer: Self-pay | Admitting: Skilled Nursing Facility1

## 2016-10-11 ENCOUNTER — Encounter: Payer: 59 | Attending: Family Medicine | Admitting: Skilled Nursing Facility1

## 2016-10-11 DIAGNOSIS — Z713 Dietary counseling and surveillance: Secondary | ICD-10-CM | POA: Insufficient documentation

## 2016-10-11 NOTE — Progress Notes (Signed)
Patient was seen on 10/11/2016 for the Weight Loss Class at the [facility]. The following learning objectives were met by the patient during this class:   Describe healthy choices in each food group  Describe portion size of foods  Use plate method for meal planning  Demonstrate how to read Nutrition Facts food label  Set realistic goals for weight loss, diet changes, and physical activity.   Goals:  1. Make healthy food choices in each food group.  2. Reduce portion size of foods.  3. Increase fruit and vegetable intake.  4. Use plate method for meal planning.  5. Increase physical activity.    Handouts given:   1. Nutrition Strategies for Weight Loss   2. Meal plan/portion card   3. MyPlate Planner   4. Weight Management Recipe Resources   5. Bake, Broil, Spring

## 2016-10-22 ENCOUNTER — Encounter: Payer: Self-pay | Admitting: Family Medicine

## 2016-10-23 ENCOUNTER — Other Ambulatory Visit: Payer: Self-pay | Admitting: Family Medicine

## 2016-10-25 ENCOUNTER — Encounter: Payer: Self-pay | Admitting: Family Medicine

## 2016-10-25 ENCOUNTER — Ambulatory Visit (INDEPENDENT_AMBULATORY_CARE_PROVIDER_SITE_OTHER): Payer: 59 | Admitting: Family Medicine

## 2016-10-25 DIAGNOSIS — M7701 Medial epicondylitis, right elbow: Secondary | ICD-10-CM | POA: Diagnosis not present

## 2016-10-25 MED ORDER — MELOXICAM 15 MG PO TABS: 15.0000 mg | ORAL_TABLET | Freq: Every day | ORAL | 2 refills | Status: DC

## 2016-10-25 NOTE — Assessment & Plan Note (Addendum)
Likely due to repetitive trauma/motion.  Discussed work Personal assistant.  Rec neoprene sleeve.

## 2016-10-25 NOTE — Progress Notes (Signed)
   Subjective:    Patient ID: Eric Bradford., male    DOB: 03/03/1976, 40 y.o.   MRN: [id number]  HPI Right elbow pain for a couple of months.  Ibuprofen helps.  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  His work is now much more physical.  Right handed and does majority of cleaning and lifting with right arm. No trauma.  Not resting on elbow. No hx of gout.  No hand numbness.    Review of Systems     Objective:   Physical Exam Pain is medial.  Not over olecronon process.  Somewhat in ulnar notch.  Mostly over medial epicondyle.  Pain increases on wrist flexion.       Assessment & Plan:

## 2016-10-25 NOTE — Patient Instructions (Signed)
I sent in a prescription for meloxicam - a once a day ibuprofen like drug. Remember that the problem is likely caused by repetitive motion.  Try doing half of each task left handed.   Also, you might want to try a neoprene sleeve for your elbow.

## 2016-11-14 DIAGNOSIS — D225 Melanocytic nevi of trunk: Secondary | ICD-10-CM | POA: Diagnosis not present

## 2016-11-14 DIAGNOSIS — B078 Other viral warts: Secondary | ICD-10-CM | POA: Diagnosis not present

## 2016-11-14 DIAGNOSIS — D2239 Melanocytic nevi of other parts of face: Secondary | ICD-10-CM | POA: Diagnosis not present

## 2016-11-15 DIAGNOSIS — G4733 Obstructive sleep apnea (adult) (pediatric): Secondary | ICD-10-CM | POA: Diagnosis not present

## 2017-02-01 DIAGNOSIS — H53143 Visual discomfort, bilateral: Secondary | ICD-10-CM | POA: Diagnosis not present

## 2017-02-01 DIAGNOSIS — H5203 Hypermetropia, bilateral: Secondary | ICD-10-CM | POA: Diagnosis not present

## 2017-02-01 DIAGNOSIS — H5501 Congenital nystagmus: Secondary | ICD-10-CM | POA: Diagnosis not present

## 2017-02-01 DIAGNOSIS — H3552 Pigmentary retinal dystrophy: Secondary | ICD-10-CM | POA: Diagnosis not present

## 2017-02-21 DIAGNOSIS — G4733 Obstructive sleep apnea (adult) (pediatric): Secondary | ICD-10-CM | POA: Diagnosis not present

## 2017-03-22 ENCOUNTER — Ambulatory Visit (INDEPENDENT_AMBULATORY_CARE_PROVIDER_SITE_OTHER): Payer: 59

## 2017-03-22 ENCOUNTER — Encounter: Payer: Self-pay | Admitting: Podiatry

## 2017-03-22 ENCOUNTER — Ambulatory Visit (INDEPENDENT_AMBULATORY_CARE_PROVIDER_SITE_OTHER): Payer: 59 | Admitting: Podiatry

## 2017-03-22 VITALS — BP 132/83 | HR 75 | Resp 16

## 2017-03-22 DIAGNOSIS — M7661 Achilles tendinitis, right leg: Secondary | ICD-10-CM

## 2017-03-22 DIAGNOSIS — M79671 Pain in right foot: Secondary | ICD-10-CM

## 2017-03-22 DIAGNOSIS — M7750 Other enthesopathy of unspecified foot: Secondary | ICD-10-CM

## 2017-03-22 DIAGNOSIS — M24573 Contracture, unspecified ankle: Secondary | ICD-10-CM | POA: Diagnosis not present

## 2017-03-22 DIAGNOSIS — M7662 Achilles tendinitis, left leg: Secondary | ICD-10-CM

## 2017-03-22 DIAGNOSIS — M722 Plantar fascial fibromatosis: Secondary | ICD-10-CM | POA: Diagnosis not present

## 2017-03-22 DIAGNOSIS — M778 Other enthesopathies, not elsewhere classified: Secondary | ICD-10-CM

## 2017-03-22 DIAGNOSIS — M779 Enthesopathy, unspecified: Principal | ICD-10-CM

## 2017-03-22 DIAGNOSIS — M79672 Pain in left foot: Principal | ICD-10-CM

## 2017-03-22 MED ORDER — MELOXICAM 15 MG PO TABS: 15.0000 mg | ORAL_TABLET | Freq: Every day | ORAL | 3 refills | Status: AC

## 2017-03-22 MED ORDER — METHYLPREDNISOLONE 4 MG PO TBPK: ORAL_TABLET | ORAL | 0 refills | Status: DC

## 2017-03-22 NOTE — Progress Notes (Signed)
   Subjective:    Patient ID: Eric Haste., male    DOB: [birth year], 41 y.o.   MRN: [id number]  HPI: He presents today with a chief complaint of plantar forefoot pain bilaterally he states regular burning sensation but also had pain in my heels times past several years he takes Advil and the heel pain is quite symptomatic.    Review of Systems  HENT: Positive for tinnitus.   Musculoskeletal: Positive for gait problem.  All other systems reviewed and are negative.      Objective:   Physical Exam: Vital signs are stable alert and oriented 3. Pulses are palpable. Neurologic sensorium is intact. Deep tendon reflexes are intact bilateral muscle strength is 5 over 5 dorsiflexion plantar flexors and inverters everters alters musculatures intact. He has severe pain on palpation of the Achilles tendon distal insertion site of the calcaneus bilaterally left greater than right. Gastroc equinus is noted. Tyloma is noted across the forefoot. Radiographs taken today do not demonstrate any Major osseous several rounds of the very large retrocalcaneal heel spurs and thickening of the Achilles tendon.        Assessment & Plan:  Retrocalcaneal heel spurs with Achilles tendinitis bilaterally. The equinus is resulting in pressure on the forefoot and burning. Started him on a Medrol Dosepak to be followed by meloxicam. He was also scanned for set of orthotics.

## 2017-03-29 ENCOUNTER — Other Ambulatory Visit: Payer: Self-pay | Admitting: *Deleted

## 2017-03-29 DIAGNOSIS — F419 Anxiety disorder, unspecified: Secondary | ICD-10-CM

## 2017-03-30 MED ORDER — OMEPRAZOLE 40 MG PO CPDR: 40.0000 mg | DELAYED_RELEASE_CAPSULE | Freq: Every day | ORAL | 0 refills | Status: DC

## 2017-03-30 MED ORDER — CLONAZEPAM 1 MG PO TABS: 1.0000 mg | ORAL_TABLET | Freq: Two times a day (BID) | ORAL | 0 refills | Status: DC | PRN

## 2017-03-30 NOTE — Telephone Encounter (Signed)
Called prescription per Dr. Nori Riis order.

## 2017-03-30 NOTE — Telephone Encounter (Signed)
Dear Dema Severin Team Please call in his clonazepam for one month as below [facility]! Dorcas Mcmurray

## 2017-04-11 ENCOUNTER — Other Ambulatory Visit: Payer: 59

## 2017-04-12 ENCOUNTER — Other Ambulatory Visit: Payer: 59

## 2017-04-26 ENCOUNTER — Other Ambulatory Visit: Payer: Self-pay | Admitting: *Deleted

## 2017-04-26 MED ORDER — CLOBETASOL PROPIONATE 0.05 % EX CREA: TOPICAL_CREAM | CUTANEOUS | 6 refills | Status: DC

## 2017-05-31 DIAGNOSIS — G4733 Obstructive sleep apnea (adult) (pediatric): Secondary | ICD-10-CM | POA: Diagnosis not present

## 2017-06-12 ENCOUNTER — Encounter: Payer: Self-pay | Admitting: Family Medicine

## 2017-06-12 NOTE — Progress Notes (Signed)
Fax from Dr. Toy Care, psychiatry.  Medication changes

## 2017-07-04 ENCOUNTER — Other Ambulatory Visit: Payer: Self-pay | Admitting: Family Medicine

## 2017-07-04 DIAGNOSIS — I10 Essential (primary) hypertension: Secondary | ICD-10-CM

## 2017-07-04 MED ORDER — HYDROCHLOROTHIAZIDE 25 MG PO TABS: 25.0000 mg | ORAL_TABLET | Freq: Every day | ORAL | 0 refills | Status: DC

## 2017-07-04 NOTE — Telephone Encounter (Signed)
Pt calling to request refill of:  Name of Medication(s):  Hydrochlorothiazide  Last date of OV:  10-25-16 Pharmacy: [facility]  Will route refill request to Clinic RN.  Discussed with patient policy to call pharmacy for future refills.  Also, discussed refills may take up to 48 hours to approve or deny.  Renella Cunas

## 2017-08-03 ENCOUNTER — Other Ambulatory Visit: Payer: Self-pay | Admitting: Family Medicine

## 2017-08-07 ENCOUNTER — Other Ambulatory Visit: Payer: Self-pay | Admitting: *Deleted

## 2017-08-07 DIAGNOSIS — I10 Essential (primary) hypertension: Secondary | ICD-10-CM

## 2017-08-07 NOTE — Telephone Encounter (Signed)
Patient calling for refill on coreg, states he is completely out and pharmacy keep saying we have denied request.

## 2017-08-08 MED ORDER — CARVEDILOL 25 MG PO TABS: ORAL_TABLET | ORAL | 3 refills | Status: DC

## 2017-10-05 ENCOUNTER — Other Ambulatory Visit: Payer: Self-pay | Admitting: Family Medicine

## 2017-10-05 DIAGNOSIS — I10 Essential (primary) hypertension: Secondary | ICD-10-CM

## 2017-10-10 DIAGNOSIS — G4733 Obstructive sleep apnea (adult) (pediatric): Secondary | ICD-10-CM | POA: Diagnosis not present

## 2017-11-08 ENCOUNTER — Other Ambulatory Visit: Payer: Self-pay | Admitting: Podiatry

## 2017-12-20 ENCOUNTER — Ambulatory Visit: Payer: 59 | Admitting: Podiatry

## 2017-12-20 ENCOUNTER — Encounter: Payer: Self-pay | Admitting: Podiatry

## 2017-12-20 ENCOUNTER — Ambulatory Visit (INDEPENDENT_AMBULATORY_CARE_PROVIDER_SITE_OTHER): Payer: 59

## 2017-12-20 DIAGNOSIS — M778 Other enthesopathies, not elsewhere classified: Secondary | ICD-10-CM

## 2017-12-20 DIAGNOSIS — M7751 Other enthesopathy of right foot: Secondary | ICD-10-CM | POA: Diagnosis not present

## 2017-12-20 DIAGNOSIS — M2580 Other specified joint disorders, unspecified joint: Secondary | ICD-10-CM

## 2017-12-20 DIAGNOSIS — M779 Enthesopathy, unspecified: Principal | ICD-10-CM

## 2017-12-20 MED ORDER — DICLOFENAC SODIUM 75 MG PO TBEC: 75.0000 mg | DELAYED_RELEASE_TABLET | Freq: Two times a day (BID) | ORAL | 0 refills | Status: DC

## 2017-12-20 NOTE — Progress Notes (Signed)
He presents today as a Oncologist stating that the plantar medial aspect of his right foot has been painful.  He states that it feels like it is burning.  He states that he recently just flared up again a few weeks ago.  He denies any trauma and states that he has not changed his shoes in quite some time.  States that he walks over 8 miles a day.  He denies fever chills nausea vomiting denies any swelling about the joint.  Objective: Vital signs are stable he is alert and oriented x3.  No erythema cellulitis drainage or odor he has pain on direct palpation of the tibial sesamoid and on range of motion of the first metatarsophalangeal joint while palpating the sesamoid.  Radiographs taken today demonstrate soft tissue increase in density around the sesamoid.  I see no fractures of the sesamoid.  No acute trauma.  No osteoarthritic change of the joint.  No open lesions or wounds are noted.  He does have a Tight Achilles tendon which forces plantar flexion of the forefoot.  Assessment: Sesamoiditis first metatarsophalangeal joint of the right foot.  Plan: After sterile Betadine skin prep I injected 2 mg of dexamethasone and 2-1/2 mg of Marcaine to the point of maximal tenderness.  He tolerated procedure well without complications.  I changed his anti-inflammatory from meloxicam to diclofenac 75 mg 1 p.o. twice daily #60 with refills.  I instructed him to continue to wear his orthotics.  Should he have to return for similar symptoms he is to bring his orthotics with [place] in case we need to change the way they fit.  He understands this is amenable to it we will follow-up with me in 1 month if necessary.

## 2018-01-02 ENCOUNTER — Telehealth: Payer: Self-pay | Admitting: *Deleted

## 2018-01-02 DIAGNOSIS — I10 Essential (primary) hypertension: Secondary | ICD-10-CM

## 2018-01-02 MED ORDER — CARVEDILOL 25 MG PO TABS
12.5000 mg | ORAL_TABLET | Freq: Two times a day (BID) | ORAL | 3 refills | Status: AC
Start: 2018-01-02 — End: ?

## 2018-01-02 NOTE — Telephone Encounter (Signed)
Called and cut dose of coreg in half.

## 2018-01-02 NOTE — Telephone Encounter (Signed)
Patient left message on nurse line stating he has lost 18-19 lbs since Dec 04 2017 with goal to lose 70 lbs total. Is taking carvedilol 25 mg bid and HCTZ 25 mg daily. Has noted resting HR ranging 46-48 and BP ~130/80. Wonders if he should cut back on meds. Also states he is not feeling bad. Returned call. Confirmed patient has no symptoms of dizziness, weakness or fatigue. Patient has not been seen in office since Nov 2017. Scheduled OV for 01/16/2018 at 3:10. Pt may be reached at [phone] for any med changes prior to upcoming appt. Hubbard Hartshorn, RN, BSN

## 2018-01-03 ENCOUNTER — Other Ambulatory Visit: Payer: Self-pay | Admitting: Family Medicine

## 2018-01-03 DIAGNOSIS — I10 Essential (primary) hypertension: Secondary | ICD-10-CM

## 2018-01-16 ENCOUNTER — Ambulatory Visit (INDEPENDENT_AMBULATORY_CARE_PROVIDER_SITE_OTHER): Payer: 59 | Admitting: Family Medicine

## 2018-01-16 ENCOUNTER — Encounter: Payer: Self-pay | Admitting: Family Medicine

## 2018-01-16 ENCOUNTER — Other Ambulatory Visit: Payer: Self-pay

## 2018-01-16 DIAGNOSIS — E7800 Pure hypercholesterolemia, unspecified: Secondary | ICD-10-CM

## 2018-01-16 DIAGNOSIS — G4733 Obstructive sleep apnea (adult) (pediatric): Secondary | ICD-10-CM

## 2018-01-16 DIAGNOSIS — I10 Essential (primary) hypertension: Secondary | ICD-10-CM | POA: Diagnosis not present

## 2018-01-16 DIAGNOSIS — E669 Obesity, unspecified: Secondary | ICD-10-CM

## 2018-01-16 DIAGNOSIS — Z0000 Encounter for general adult medical examination without abnormal findings: Secondary | ICD-10-CM

## 2018-01-16 DIAGNOSIS — Z0001 Encounter for general adult medical examination with abnormal findings: Secondary | ICD-10-CM

## 2018-01-16 NOTE — Patient Instructions (Addendum)
Great job on the weight loss.   Please stop the hydrochlorothiazide.  Monitor your home blood pressure.   I will call with lab work.

## 2018-01-16 NOTE — Assessment & Plan Note (Addendum)
Recent focus on wt loss.  He has every intention on continuing.

## 2018-01-16 NOTE — Progress Notes (Signed)
Date of Visit: 01/16/2018   HPI: [name] is a 42 year old male with PMH of HTN, OSA, and anxiety who presents for his yearly physical exam. He reports losing 25 pounds in the last few months by cutting out sodas and excessive sweets. He has inquired about being able to lower some of his blood pressure medications. He has been keeping track of his blood pressure at home and records them on his phone. His typical pressures are in the 120s/70s. He does not endorse any lows. He does endorse some orthostatic hypotension where he gets dizzy, resolving within seconds. He notices that this is improved when he is adequately hydrated. His only other concern today is being addicted to Afrin, which he has used for the last three years. He says he is unable to sleep with his CPAP on when he does not use the nasal spray.   ROS: See HPI.  [place]: Patient is a non-smoker. Now interested in leading healthier lifestyle and diet and exercise modifications. Married to male partner. Works as Secretary/administrator.   PHYSICAL EXAM: BP 116/64   Pulse 62   Temp 98.1 F (36.7 C) (Oral)   Ht _0  (1.803 m)   Wt 235 lb 6.4 oz (106.8 kg)   SpO2 98%   BMI 32.83 kg/m  Gen: Well appearing male, NAD. HEENT: Normocephalic, EOMI, PERRLA, no cervical lymphadenopathy, oropharynx clear, no thyromegaly, neck is supple.  Heart: RRR, no murmurs, rubs, or gallops.  Lungs: CTAB, no wheezes or crackles. Neuro: No focal deficits.  Ext: No peripheral edema. Eczematous rash on bilateral hands.   ASSESSMENT/PLAN:  [name] is a 42 year old male here for his yearly physical and HTN maintenance.  #HTN: - Given that patient is experiencing orthostatic symptoms, has been keeping close records of his BP readings, and is now losing weight, we will discontinue HCTZ 25 mg at this time.  - Patient will remain on Carvedilol 12.5 mg BID.  #Afrin Addiction - Advised patient to begin by stopping nasal spray one nostril at a time to begin  weaning process - Advised on using nasal saline to help with moisurization of nares.   Health maintenance:  - Patient UTD: will obtain flu shot and Tdap records via employee health.  - Follow up labs: CBC, CMP+ EGFR, and lipid panel.  Dot Lanes, MS3

## 2018-01-17 ENCOUNTER — Encounter: Payer: Self-pay | Admitting: Family Medicine

## 2018-01-17 LAB — CMP14+EGFR
ALBUMIN: 4.4 g/dL (ref 3.5–5.5)
ALK PHOS: 78 IU/L (ref 39–117)
ALT: 42 IU/L (ref 0–44)
AST: 25 IU/L (ref 0–40)
Albumin/Globulin Ratio: 1.8 (ref 1.2–2.2)
BUN / CREAT RATIO: 13 (ref 9–20)
BUN: 12 mg/dL (ref 6–24)
Bilirubin Total: 0.5 mg/dL (ref 0.0–1.2)
CHLORIDE: 102 mmol/L (ref 96–106)
CO2: 24 mmol/L (ref 20–29)
CREATININE: 0.89 mg/dL (ref 0.76–1.27)
Calcium: 9.7 mg/dL (ref 8.7–10.2)
GFR calc non Af Amer: 105 mL/min/{1.73_m2} (ref >59)
GFR, EST AFRICAN AMERICAN: 122 mL/min/{1.73_m2} (ref >59)
GLOBULIN, TOTAL: 2.4 g/dL (ref 1.5–4.5)
Glucose: 79 mg/dL (ref 65–99)
Potassium: 3.7 mmol/L (ref 3.5–5.2)
Sodium: 143 mmol/L (ref 134–144)
Total Protein: 6.8 g/dL (ref 6.0–8.5)

## 2018-01-17 LAB — CBC
HEMOGLOBIN: 14.4 g/dL (ref 13.0–17.7)
Hematocrit: 40.7 % (ref 37.5–51.0)
MCH: 28.1 pg (ref 26.6–33.0)
MCHC: 35.4 g/dL (ref 31.5–35.7)
MCV: 79 fL (ref 79–97)
Platelets: 238 10*3/uL (ref 150–379)
RBC: 5.13 x10E6/uL (ref 4.14–5.80)
RDW: 13.9 % (ref 12.3–15.4)
WBC: 9.5 10*3/uL (ref 3.4–10.8)

## 2018-01-17 LAB — LIPID PANEL
CHOLESTEROL TOTAL: 169 mg/dL (ref 100–199)
Chol/HDL Ratio: 6.8 ratio — ABNORMAL HIGH (ref 0.0–5.0)
HDL: 25 mg/dL — ABNORMAL LOW (ref >39)
LDL Calculated: 98 mg/dL (ref 0–99)
TRIGLYCERIDES: 231 mg/dL — AB (ref 0–149)
VLDL Cholesterol Cal: 46 mg/dL — ABNORMAL HIGH (ref 5–40)

## 2018-01-17 NOTE — Assessment & Plan Note (Signed)
Recheck lipids

## 2018-01-17 NOTE — Assessment & Plan Note (Signed)
Stable on current meds.  Discussed how to wean off Afrin.

## 2018-01-17 NOTE — Assessment & Plan Note (Signed)
No longer qualifies as morbid obesity.

## 2018-01-17 NOTE — Assessment & Plan Note (Signed)
DC HCTZ and monitor.

## 2018-01-25 ENCOUNTER — Other Ambulatory Visit: Payer: Self-pay | Admitting: Podiatry

## 2018-01-25 ENCOUNTER — Other Ambulatory Visit: Payer: Self-pay

## 2018-01-25 MED ORDER — DICLOFENAC SODIUM 75 MG PO TBEC: 75.0000 mg | DELAYED_RELEASE_TABLET | Freq: Two times a day (BID) | ORAL | 0 refills | Status: AC

## 2018-01-25 NOTE — Progress Notes (Signed)
Patient called requesting a refill for his diclofenac prescription, he states that it has really help with his foot pain. Refill authorized for diclofenac 75mg  BID #60, any further refill requests will require an appt.

## 2018-01-30 ENCOUNTER — Ambulatory Visit: Payer: 59

## 2018-01-31 ENCOUNTER — Encounter: Payer: Self-pay | Admitting: Family Medicine

## 2018-02-13 DIAGNOSIS — G4733 Obstructive sleep apnea (adult) (pediatric): Secondary | ICD-10-CM | POA: Diagnosis not present

## 2018-02-21 DIAGNOSIS — H5501 Congenital nystagmus: Secondary | ICD-10-CM | POA: Diagnosis not present

## 2018-02-21 DIAGNOSIS — H3552 Pigmentary retinal dystrophy: Secondary | ICD-10-CM | POA: Diagnosis not present

## 2018-02-21 DIAGNOSIS — H25033 Anterior subcapsular polar age-related cataract, bilateral: Secondary | ICD-10-CM | POA: Diagnosis not present

## 2018-02-21 DIAGNOSIS — H53143 Visual discomfort, bilateral: Secondary | ICD-10-CM | POA: Diagnosis not present

## 2018-05-10 ENCOUNTER — Other Ambulatory Visit: Payer: Self-pay | Admitting: Family Medicine

## 2018-06-07 DIAGNOSIS — G4733 Obstructive sleep apnea (adult) (pediatric): Secondary | ICD-10-CM | POA: Diagnosis not present

## 2018-07-18 DIAGNOSIS — Z3009 Encounter for other general counseling and advice on contraception: Secondary | ICD-10-CM | POA: Diagnosis not present

## 2018-10-26 DIAGNOSIS — M25562 Pain in left knee: Secondary | ICD-10-CM | POA: Diagnosis not present

## 2018-10-28 ENCOUNTER — Other Ambulatory Visit: Payer: Self-pay | Admitting: Family Medicine

## 2018-10-30 DIAGNOSIS — M25562 Pain in left knee: Secondary | ICD-10-CM | POA: Diagnosis not present

## 2018-11-08 DIAGNOSIS — G4733 Obstructive sleep apnea (adult) (pediatric): Secondary | ICD-10-CM | POA: Diagnosis not present

## 2018-12-25 DIAGNOSIS — F411 Generalized anxiety disorder: Secondary | ICD-10-CM | POA: Diagnosis not present

## 2019-02-24 ENCOUNTER — Telehealth: Payer: Self-pay | Admitting: Family Medicine

## 2019-02-24 NOTE — Telephone Encounter (Signed)
Patient called and asked for work note.  He has missed now 3 days worth of work with minor headaches.  He has not been completely incapacitated unable to stand, has not had any photophobia but he has had some slight associated nausea with no neurological symptoms.  He has not had any fevers he is not had any respiratory symptoms he is not any activity level.  He is speaking in full and complete sentences.  The only nausea he had was when the headache was at its worst which then resolved spontaneously with a headache which is been coming and going.  He has been using his current medication as a symptomatic treatment for the headache.  We discussed that these headaches symptoms do not sound consistent with anything infectious that I am aware of.  That if he gets new symptoms he should call back and let us know.  He should continue to practice social distancing and proper hygiene when he returns to work.  But I can write him a note that states he was out because he felt he could not do his job fully with his headache symptoms but as long as those have resolved that I do not have any believe reason to believe that he would be infectious.  Dr. Criss Rosales

## 2019-02-25 NOTE — Telephone Encounter (Signed)
I was preceptor the day of this visit.   

## 2020-12-02 ENCOUNTER — Encounter (HOSPITAL_COMMUNITY): Payer: Self-pay | Admitting: Emergency Medicine

## 2020-12-02 ENCOUNTER — Other Ambulatory Visit: Payer: Self-pay

## 2020-12-02 ENCOUNTER — Emergency Department (HOSPITAL_COMMUNITY): Payer: Worker's Compensation

## 2020-12-02 ENCOUNTER — Emergency Department (HOSPITAL_COMMUNITY)
Admission: EM | Admit: 2020-12-02 | Discharge: 2020-12-02 | Disposition: A | Discharge status: Home / Self Care | Payer: Worker's Compensation | Attending: Emergency Medicine | Admitting: Emergency Medicine

## 2020-12-02 DIAGNOSIS — S0993XA Unspecified injury of face, initial encounter: Secondary | ICD-10-CM | POA: Diagnosis present

## 2020-12-02 DIAGNOSIS — I10 Essential (primary) hypertension: Secondary | ICD-10-CM | POA: Diagnosis not present

## 2020-12-02 DIAGNOSIS — S01401A Unspecified open wound of right cheek and temporomandibular area, initial encounter: Secondary | ICD-10-CM | POA: Diagnosis not present

## 2020-12-02 DIAGNOSIS — Z79899 Other long term (current) drug therapy: Secondary | ICD-10-CM | POA: Insufficient documentation

## 2020-12-02 DIAGNOSIS — W3400XA Accidental discharge from unspecified firearms or gun, initial encounter: Secondary | ICD-10-CM | POA: Insufficient documentation

## 2020-12-02 DIAGNOSIS — T1490XA Injury, unspecified, initial encounter: Secondary | ICD-10-CM

## 2020-12-02 LAB — CBC WITH DIFFERENTIAL/PLATELET
Abs Immature Granulocytes: 0.15 10*3/uL — ABNORMAL HIGH (ref 0.00–0.07)
Basophils Absolute: 0.1 10*3/uL (ref 0.0–0.1)
Basophils Relative: 1 %
Eosinophils Absolute: 0.3 10*3/uL (ref 0.0–0.5)
Eosinophils Relative: 2 %
HCT: 43.3 % (ref 39.0–52.0)
Hemoglobin: 14.2 g/dL (ref 13.0–17.0)
Immature Granulocytes: 1 %
Lymphocytes Relative: 27 %
Lymphs Abs: 3.5 10*3/uL (ref 0.7–4.0)
MCH: 27.4 pg (ref 26.0–34.0)
MCHC: 32.8 g/dL (ref 30.0–36.0)
MCV: 83.6 fL (ref 80.0–100.0)
Monocytes Absolute: 1.3 10*3/uL — ABNORMAL HIGH (ref 0.1–1.0)
Monocytes Relative: 10 %
Neutro Abs: 7.7 10*3/uL (ref 1.7–7.7)
Neutrophils Relative %: 59 %
Platelets: 305 10*3/uL (ref 150–400)
RBC: 5.18 MIL/uL (ref 4.22–5.81)
RDW: 12.8 % (ref 11.5–15.5)
WBC: 13 10*3/uL — ABNORMAL HIGH (ref 4.0–10.5)
nRBC: 0 % (ref 0.0–0.2)

## 2020-12-02 LAB — BASIC METABOLIC PANEL
Anion gap: 11 (ref 5–15)
BUN: 14 mg/dL (ref 6–20)
CO2: 21 mmol/L — ABNORMAL LOW (ref 22–32)
Calcium: 9.4 mg/dL (ref 8.9–10.3)
Chloride: 104 mmol/L (ref 98–111)
Creatinine, Ser: 1.13 mg/dL (ref 0.61–1.24)
GFR, Estimated: >60 mL/min (ref >60)
Glucose, Bld: 109 mg/dL — ABNORMAL HIGH (ref 70–99)
Potassium: 3.8 mmol/L (ref 3.5–5.1)
Sodium: 136 mmol/L (ref 135–145)

## 2020-12-02 LAB — I-STAT CHEM 8, ED
BUN: 14 mg/dL (ref 6–20)
Calcium, Ion: 1.23 mmol/L (ref 1.15–1.40)
Chloride: 105 mmol/L (ref 98–111)
Creatinine, Ser: 1 mg/dL (ref 0.61–1.24)
Glucose, Bld: 106 mg/dL — ABNORMAL HIGH (ref 70–99)
HCT: 42 % (ref 39.0–52.0)
Hemoglobin: 14.3 g/dL (ref 13.0–17.0)
Potassium: 3.7 mmol/L (ref 3.5–5.1)
Sodium: 141 mmol/L (ref 135–145)
TCO2: 22 mmol/L (ref 22–32)

## 2020-12-02 MED ORDER — CEPHALEXIN 500 MG PO CAPS: 500.0000 mg | ORAL_CAPSULE | Freq: Two times a day (BID) | ORAL | 0 refills | Status: AC

## 2020-12-02 MED ORDER — IOHEXOL 350 MG/ML SOLN
75.0000 mL | Freq: Once | INTRAVENOUS | Status: AC | PRN
  Administered 2020-12-02: 75 mL via INTRAVENOUS

## 2020-12-02 NOTE — Progress Notes (Signed)
Orthopedic Tech Progress Note Patient Details:  Eric Bradford 07/01/1976 [id number] Level 1 Trauma  Patient ID: Eric Dung., male   DOB: 1976/10/17, 44 y.o.   MRN: [id number]    Eric Bradford 12/02/2020, 9:48 PM
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
## 2023-10-22 LAB — COLOGUARD: COLOGUARD: NEGATIVE

## 2024-02-24 ENCOUNTER — Other Ambulatory Visit: Payer: Self-pay

## 2024-02-24 ENCOUNTER — Emergency Department (HOSPITAL_COMMUNITY)
Admission: EM | Admit: 2024-02-24 | Discharge: 2024-02-24 | Disposition: A | Discharge status: Home / Self Care | Payer: Self-pay | Attending: Emergency Medicine | Admitting: Emergency Medicine

## 2024-02-24 ENCOUNTER — Encounter (HOSPITAL_COMMUNITY): Payer: Self-pay

## 2024-02-24 DIAGNOSIS — F1210 Cannabis abuse, uncomplicated: Secondary | ICD-10-CM | POA: Insufficient documentation

## 2024-02-24 DIAGNOSIS — F419 Anxiety disorder, unspecified: Secondary | ICD-10-CM | POA: Diagnosis not present

## 2024-02-24 DIAGNOSIS — F19920 Other psychoactive substance use, unspecified with intoxication, uncomplicated: Secondary | ICD-10-CM

## 2024-02-24 NOTE — ED Provider Notes (Signed)
 [facility] AT [facility] Provider Note   CSN: [id number] Arrival date & time: 02/24/24  1735     History  Chief Complaint  Patient presents with   Anxiety    Eric Bradford. is a 48 y.o. male.  48 year old male presents feeling weak and tired after ingesting THC Gummies.  States that this was not a suicide attempt.  He does have a show anxiety and felt that that has been getting worse.  Denies any intentional other ingestions.  No alcohol use.  Has not had any abdominal pain or vomiting.       Home Medications Prior to Admission medications   Medication Sig Start Date End Date Taking? Authorizing Provider  ALPRAZolam Prudy Feeler) 1 MG tablet Take 1 mg by mouth every 4 (four) hours as needed for anxiety. Per psych, Dr. Evelene Croon    [provider]  carvedilol (COREG) 25 MG tablet Take 0.5 tablets (12.5 mg total) by mouth 2 (two) times daily with a meal. TAKE 1 TABLET BY MOUTH 2 TIMES DAILY WITH A MEAL 01/02/18   Hensel, Santiago Bumpers, MD  cholecalciferol (VITAMIN D) 1000 UNITS tablet Take 1,000 Units by mouth daily. Reported on 04/14/2016    [provider]  citalopram (CELEXA) 20 MG tablet Take 20 mg by mouth daily. Per psych, Dr. Evelene Croon    [provider]  clobetasol cream (TEMOVATE) 0.05 % APPLY TOPICALLY DAILY AS NEEDED FOR HANDS 05/10/18   Moses Manners, MD  diclofenac (VOLTAREN) 75 MG EC tablet Take 1 tablet (75 mg total) by mouth 2 (two) times daily. 01/25/18   Hyatt, Max T, DPM  meloxicam (MOBIC) 15 MG tablet Take 1 tablet (15 mg total) by mouth daily. Patient not taking: Reported on 01/16/2018 03/22/17   Hyatt, Max T, DPM  omeprazole (PRILOSEC) 40 MG capsule TAKE 1 CAPSULE BY MOUTH DAILY. 10/28/18   Moses Manners, MD  polyethylene glycol powder (GLYCOLAX/MIRALAX) powder MIX 17 GRAMS IN LIQUID OF CHOICE AND DRINK TWICE DAILY AS NEEDED 10/23/16   Moses Manners, MD      Allergies    Patient has no known allergies.     Review of Systems   Review of Systems  All other systems reviewed and are negative.   Physical Exam Updated Vital Signs BP (!) 170/106   Pulse 89   Temp 97.6 F (36.4 C)   Resp (!) 24   Ht 1.803 m (5\' 11" )   Wt 106 kg   SpO2 96%   BMI 32.59 kg/m  Physical Exam Vitals and nursing note reviewed.  Constitutional:      General: He is not in acute distress.    Appearance: Normal appearance. He is well-developed. He is not toxic-appearing.  HENT:     Head: Normocephalic and atraumatic.  Eyes:     General: Lids are normal.     Conjunctiva/sclera: Conjunctivae normal.     Pupils: Pupils are equal, round, and reactive to light.  Neck:     Thyroid: No thyroid mass.     Trachea: No tracheal deviation.  Cardiovascular:     Rate and Rhythm: Normal rate and regular rhythm.     Heart sounds: Normal heart sounds. No murmur heard.    No gallop.  Pulmonary:     Effort: Pulmonary effort is normal. No respiratory distress.     Breath sounds: Normal breath sounds. No stridor. No decreased breath sounds, wheezing, rhonchi or rales.  Abdominal:  General: There is no distension.     Palpations: Abdomen is soft.     Tenderness: There is no abdominal tenderness. There is no rebound.  Musculoskeletal:        General: No tenderness. Normal range of motion.     Cervical back: Normal range of motion and neck supple.  Skin:    General: Skin is warm and dry.     Findings: No abrasion or rash.  Neurological:     Mental Status: He is alert and oriented to person, place, and time. Mental status is at baseline.     GCS: GCS eye subscore is 4. GCS verbal subscore is 5. GCS motor subscore is 6.     Cranial Nerves: No cranial nerve deficit.     Sensory: No sensory deficit.     Motor: Motor function is intact.  Psychiatric:        Attention and Perception: Attention normal.        Mood and Affect: Affect is blunt and flat.        Speech: Speech is delayed.        Behavior: Behavior is  slowed.     ED Results / Procedures / Treatments   Labs (all labs ordered are listed, but only abnormal results are displayed) Labs Reviewed - No data to display  EKG None  Radiology No results found.  Procedures Procedures    Medications Ordered in ED Medications - No data to display  ED Course/ Medical Decision Making/ A&P                                 Medical Decision Making  Patient monitored here after ingesting excess amount of THC Gummies.  Patient after several hours now feels better.  Will discharge back home        Final Clinical Impression(s) / ED Diagnoses Final diagnoses:  None    Rx / DC Orders ED Discharge Orders     None         Lorre Nick, MD 02/24/24 2115

## 2024-02-24 NOTE — Discharge Instructions (Signed)
 Drink plenty of liquids and get some rest

## 2024-02-24 NOTE — ED Triage Notes (Signed)
 Pt consumed THC gummy (1000mg ) about 2 hours ago and now feels anxious, like he can't move his body, and he feels SOB. O2 is 97% on RA. Pt also had his xanax this am that is XR.

## 2024-02-24 NOTE — ED Notes (Signed)
 Provided patient with urinal

## 2024-02-24 NOTE — ED Notes (Signed)
 Patient d/c with home care instructions.
